# Patient Record
Sex: Female | Born: 2000 | Race: Black or African American | Hispanic: No | Marital: Single | Smoking: Never smoker
Health system: Southern US, Community
[De-identification: ages and names within clinical notes are randomized; demographics above are authoritative.]

## PROBLEM LIST (undated history)

## (undated) DIAGNOSIS — M419 Scoliosis, unspecified: Secondary | ICD-10-CM

## (undated) DIAGNOSIS — T7840XA Allergy, unspecified, initial encounter: Secondary | ICD-10-CM

## (undated) DIAGNOSIS — J45909 Unspecified asthma, uncomplicated: Secondary | ICD-10-CM

## (undated) HISTORY — DX: Unspecified asthma, uncomplicated: J45.909

## (undated) HISTORY — PX: FRACTURE SURGERY: SHX138

## (undated) HISTORY — DX: Allergy, unspecified, initial encounter: T78.40XA

## (undated) HISTORY — DX: Scoliosis, unspecified: M41.9

---
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
## 2018-07-27 DIAGNOSIS — R109 Unspecified abdominal pain: Secondary | ICD-10-CM | POA: Diagnosis not present

## 2018-07-27 DIAGNOSIS — N898 Other specified noninflammatory disorders of vagina: Secondary | ICD-10-CM | POA: Diagnosis not present

## 2018-08-05 ENCOUNTER — Ambulatory Visit (INDEPENDENT_AMBULATORY_CARE_PROVIDER_SITE_OTHER): Payer: 59 | Admitting: Medical

## 2018-08-05 ENCOUNTER — Encounter: Payer: Self-pay | Admitting: Medical

## 2018-08-05 VITALS — BP 110/60 | HR 88 | Temp 98.3°F | Resp 16 | Ht 66.0 in | Wt 113.4 lb

## 2018-08-05 DIAGNOSIS — N760 Acute vaginitis: Secondary | ICD-10-CM | POA: Diagnosis not present

## 2018-08-05 DIAGNOSIS — D72829 Elevated white blood cell count, unspecified: Secondary | ICD-10-CM | POA: Insufficient documentation

## 2018-08-05 DIAGNOSIS — R1010 Upper abdominal pain, unspecified: Secondary | ICD-10-CM | POA: Insufficient documentation

## 2018-08-05 DIAGNOSIS — M2170 Unequal limb length (acquired), unspecified site: Secondary | ICD-10-CM | POA: Diagnosis not present

## 2018-08-05 DIAGNOSIS — M419 Scoliosis, unspecified: Secondary | ICD-10-CM

## 2018-08-05 DIAGNOSIS — B9689 Other specified bacterial agents as the cause of diseases classified elsewhere: Secondary | ICD-10-CM | POA: Diagnosis not present

## 2018-08-05 NOTE — Progress Notes (Signed)
Subjective:     Patient ID: Brenda Kane, female   DOB: 2001/03/03, 18 y.o.   MRN: [id number]  HPI Chief Complaint  Patient presents with  . NP    NP to get  established, stomach pain X 2 weeks   Here as a new patient.  Referred by gynecology.    She notes some pains in upper stomach and lower chest x 2 week, pain under ribs, more focused in middle.   Pain worse with sneezing.   Had some nausea the first week, but no vomiting.   Used some anti-nausea medication.    No diarrhea.  No constipation.  Has BM 2 or 3 x daily.   Denies lots of bloating and gas.  No recent SOB, dyspnea, no recent asthma flare.   No recent injury or trauma.   No recent strenuous exercise.     denies eating a lot of acidic or spicy foods.   No recent use of GERD medication, no prior diagnosis of GERD.   No recent fever, no night sweats, no chills.    Saw gynecology recently, had some labs that showed elevated white cells.   Just recently finished antibiotic for bacterial vagina infection.   No STD.  No hx/o ovarian cyst, no hx/o endometriosis.     Denies urinary symptoms today.     Past Medical History:  Diagnosis Date  . Allergy   . Asthma    Current Outpatient Medications on File Prior to Visit  Medication Sig Dispense Refill  . albuterol (PROVENTIL HFA;VENTOLIN HFA) 108 (90 BASE) MCG/ACT inhaler Inhale 2 puffs into the lungs every 6 (six) hours as needed for wheezing.    . montelukast (SINGULAIR) 10 MG tablet Take 10 mg by mouth at bedtime.     No current facility-administered medications on file prior to visit.    Review of Systems As in subjective    Objective:   Physical Exam  BP 110/60   Pulse 88   Temp 98.3 F (36.8 C) (Oral)   Resp 16   Ht 5\' 6"  (1.676 m)   Wt 113 lb 6.4 oz (51.4 kg)   LMP 07/30/2018 (Exact Date)   SpO2 99%   BMI 18.30 kg/m   General appearance: alert, no distress, WD/WN, petite AA female Lungs: CTA bilaterally, no wheezes, rhonchi, or rales Back with moderate  scoliosis but nontender, normal ROM Right leg seems slightly shorter than left, but legs and hips nontender, normal ROM Abdomen: +bs, soft, mild generalized upper tendonesis, mild suprapubic tenderness, otherwise  non tender, non distended, no masses, no hepatomegaly, no splenomegaly Pulses: 2+ symmetric, upper and lower extremities, normal cap refill Legs neurovascularly intact  I reviewed labs from care everywhere from 07/27/2018, comprehensive metabolic panel was normal, white blood cell count was 13.7 thousand, absolute neutrophils absolute eosinophils somewhat elevated, lipase normal, urine dipstick with large blood, on her menstrual period, urine culture sent but I do not have results for this.  Wet prep showed positive whiff test clue cells     Assessment:     Encounter Diagnoses  Name Primary?  Marland Kitchen Upper abdominal pain Yes  . Leukocytosis, unspecified type   . Bacterial vaginitis   . Scoliosis, unspecified scoliosis type, unspecified spinal region   . Leg length difference, acquired        Plan:     Discussed symptoms, exam findings, reviewed recent gynecology office note and labs.   We appreciate gynecology referral.     Discussed possible causes  of symptoms.    discussed recommendations below.  Patient Instructions  You just recently use an antibiotic for bacterial vaginal infection which can cause your white cells to be elevated and give you some belly discomfort  Metronidazole can sometimes cause belly discomfort by itself   Recommendations  Begin sample of Dexilant 1 tablet daily for the next 8 days   Avoid spicy and acidic foods including peppers, tomato-based foods, salsa, spaghetti sauce, hot sauce, orange juice, lemons, limes, or other acidic or spicy foods  Do not eat fast, slow down your eating  Avoid big portions and a lot of junk food  Begin using a foam or other shoe insert in the right shoe to help equalize leg length discrepancy which is likely due to  your scoliosis  Stretch regularly, exercise regularly  Let us wait out the next 2 weeks to see if your symptoms resolve  If your symptoms do not improve over the next 2 weeks or if you have new or worsening symptoms call or recheck as there might be other tests we need to do such as x-ray or ultrasound or other  Regardless let us plan to recheck your blood count in a month or so

## 2018-08-05 NOTE — Patient Instructions (Signed)
You just recently use an antibiotic for bacterial vaginal infection which can cause your white cells to be elevated and give you some belly discomfort  Metronidazole can sometimes cause belly discomfort by itself   Recommendations  Begin sample of Dexilant 1 tablet daily for the next 8 days   Avoid spicy and acidic foods including peppers, tomato-based foods, salsa, spaghetti sauce, hot sauce, orange juice, lemons, limes, or other acidic or spicy foods  Do not eat fast, slow down your eating  Avoid big portions and a lot of junk food  Begin using a foam or other shoe insert in the right shoe to help equalize leg length discrepancy which is likely due to your scoliosis  Stretch regularly, exercise regularly  Let us wait out the next 2 weeks to see if your symptoms resolve  If your symptoms do not improve over the next 2 weeks or if you have new or worsening symptoms call or recheck as there might be other tests we need to do such as x-ray or ultrasound or other  Regardless let us plan to recheck your blood count in a month or so

## 2018-09-02 ENCOUNTER — Telehealth: Payer: Self-pay

## 2018-09-02 NOTE — Telephone Encounter (Signed)
Called pt to advise of [employer] and to ask about last labs. No answer LVM KH

## 2018-09-03 ENCOUNTER — Ambulatory Visit: Payer: 59 | Admitting: Medical

## 2018-11-27 DIAGNOSIS — J3089 Other allergic rhinitis: Secondary | ICD-10-CM | POA: Diagnosis not present

## 2018-11-27 DIAGNOSIS — J301 Allergic rhinitis due to pollen: Secondary | ICD-10-CM | POA: Diagnosis not present

## 2018-11-27 DIAGNOSIS — J4520 Mild intermittent asthma, uncomplicated: Secondary | ICD-10-CM | POA: Diagnosis not present

## 2018-11-27 DIAGNOSIS — J3081 Allergic rhinitis due to animal (cat) (dog) hair and dander: Secondary | ICD-10-CM | POA: Diagnosis not present

## 2019-02-04 ENCOUNTER — Telehealth: Payer: Self-pay | Admitting: Medical

## 2019-02-04 NOTE — Telephone Encounter (Signed)
Pt called and wanted to know if she could get a not emailed to her stating that she has ADHD because she has a large dog and will be moving to an apt and they told her she needed a note basically stating that she needs the dog for comfort. She can be reached at [phone]

## 2019-02-05 NOTE — Telephone Encounter (Signed)
This would require a visit.  I don't show any chart record of ADHD.   If we don't have records scanned in chart from prior records (please check), then we need her to sign to get prior records showing hx/o ADHD.

## 2019-02-05 NOTE — Telephone Encounter (Signed)
Called and left pt a voicemail to schedule visit

## 2019-03-11 ENCOUNTER — Encounter: Payer: Self-pay | Admitting: Family Medicine

## 2019-03-11 ENCOUNTER — Ambulatory Visit (INDEPENDENT_AMBULATORY_CARE_PROVIDER_SITE_OTHER): Payer: 59 | Admitting: Family Medicine

## 2019-03-11 ENCOUNTER — Other Ambulatory Visit: Payer: Self-pay | Admitting: Family Medicine

## 2019-03-11 ENCOUNTER — Other Ambulatory Visit: Payer: Self-pay

## 2019-03-11 ENCOUNTER — Ambulatory Visit
Admission: RE | Admit: 2019-03-11 | Discharge: 2019-03-11 | Disposition: A | Discharge status: Home / Self Care | Payer: 59 | Source: Ambulatory Visit | Attending: Family Medicine | Admitting: Family Medicine

## 2019-03-11 VITALS — BP 122/72 | HR 103 | Temp 98.2°F | Wt 120.0 lb

## 2019-03-11 DIAGNOSIS — M545 Low back pain, unspecified: Secondary | ICD-10-CM

## 2019-03-11 DIAGNOSIS — M25552 Pain in left hip: Secondary | ICD-10-CM

## 2019-03-11 DIAGNOSIS — M419 Scoliosis, unspecified: Secondary | ICD-10-CM | POA: Diagnosis not present

## 2019-03-11 DIAGNOSIS — M2170 Unequal limb length (acquired), unspecified site: Secondary | ICD-10-CM

## 2019-03-11 DIAGNOSIS — M25562 Pain in left knee: Secondary | ICD-10-CM | POA: Diagnosis not present

## 2019-03-11 DIAGNOSIS — G8929 Other chronic pain: Secondary | ICD-10-CM

## 2019-03-11 DIAGNOSIS — M4186 Other forms of scoliosis, lumbar region: Secondary | ICD-10-CM | POA: Diagnosis not present

## 2019-03-11 DIAGNOSIS — R1032 Left lower quadrant pain: Secondary | ICD-10-CM | POA: Diagnosis not present
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
## 2019-03-11 NOTE — Patient Instructions (Signed)
Go to [facility] for X rays of your low back and left hip.   I am referring you to [place]. They will call you to schedule.   In the meantime, you may take Advil 600 mg 2-3 times per day with food and a full glass of water.  Use heat or ice 2-3 times per day.   If you were to have total leg numbness, loss of control of your bladder or bowels or any other serious symptoms, you should to the emergency department.

## 2019-03-17 DIAGNOSIS — M419 Scoliosis, unspecified: Secondary | ICD-10-CM | POA: Diagnosis not present

## 2019-03-17 DIAGNOSIS — Z681 Body mass index (BMI) 19 or less, adult: Secondary | ICD-10-CM | POA: Diagnosis not present

## 2019-03-22 ENCOUNTER — Other Ambulatory Visit: Payer: Self-pay | Admitting: Orthopaedic Surgery

## 2019-03-22 DIAGNOSIS — M419 Scoliosis, unspecified: Secondary | ICD-10-CM

## 2019-04-01 DIAGNOSIS — Z3040 Encounter for surveillance of contraceptives, unspecified: Secondary | ICD-10-CM | POA: Diagnosis not present

## 2019-04-01 DIAGNOSIS — N920 Excessive and frequent menstruation with regular cycle: Secondary | ICD-10-CM | POA: Diagnosis not present

## 2019-04-03 ENCOUNTER — Emergency Department (HOSPITAL_COMMUNITY)
Admission: EM | Admit: 2019-04-03 | Discharge: 2019-04-04 | Disposition: A | Discharge status: Home / Self Care | Payer: 59 | Attending: Emergency Medicine | Admitting: Emergency Medicine

## 2019-04-03 ENCOUNTER — Encounter (HOSPITAL_COMMUNITY): Payer: Self-pay | Admitting: Emergency Medicine

## 2019-04-03 ENCOUNTER — Emergency Department (HOSPITAL_COMMUNITY): Payer: 59

## 2019-04-03 ENCOUNTER — Other Ambulatory Visit: Payer: Self-pay

## 2019-04-03 DIAGNOSIS — M25532 Pain in left wrist: Secondary | ICD-10-CM | POA: Diagnosis not present

## 2019-04-03 DIAGNOSIS — Z5321 Procedure and treatment not carried out due to patient leaving prior to being seen by health care provider: Secondary | ICD-10-CM | POA: Insufficient documentation

## 2019-04-03 DIAGNOSIS — S63502A Unspecified sprain of left wrist, initial encounter: Secondary | ICD-10-CM | POA: Diagnosis not present

## 2019-04-03 DIAGNOSIS — M7989 Other specified soft tissue disorders: Secondary | ICD-10-CM | POA: Diagnosis not present

## 2019-04-03 NOTE — ED Triage Notes (Signed)
Pt c/o left wrist pain and swelling after falling today.

## 2019-04-04 ENCOUNTER — Encounter (HOSPITAL_COMMUNITY): Payer: Self-pay

## 2019-04-04 ENCOUNTER — Emergency Department (HOSPITAL_COMMUNITY): Payer: 59

## 2019-04-04 ENCOUNTER — Emergency Department (HOSPITAL_COMMUNITY)
Admission: EM | Admit: 2019-04-04 | Discharge: 2019-04-04 | Disposition: A | Discharge status: Home / Self Care | Payer: 59 | Source: Home / Self Care | Attending: Emergency Medicine | Admitting: Emergency Medicine

## 2019-04-04 ENCOUNTER — Ambulatory Visit
Admission: RE | Admit: 2019-04-04 | Discharge: 2019-04-04 | Disposition: A | Discharge status: Home / Self Care | Payer: 59 | Source: Ambulatory Visit | Attending: Orthopaedic Surgery | Admitting: Orthopaedic Surgery

## 2019-04-04 DIAGNOSIS — Y939 Activity, unspecified: Secondary | ICD-10-CM | POA: Insufficient documentation

## 2019-04-04 DIAGNOSIS — M4186 Other forms of scoliosis, lumbar region: Secondary | ICD-10-CM | POA: Diagnosis not present

## 2019-04-04 DIAGNOSIS — S63502A Unspecified sprain of left wrist, initial encounter: Secondary | ICD-10-CM

## 2019-04-04 DIAGNOSIS — Z79899 Other long term (current) drug therapy: Secondary | ICD-10-CM | POA: Insufficient documentation

## 2019-04-04 DIAGNOSIS — W010XXA Fall on same level from slipping, tripping and stumbling without subsequent striking against object, initial encounter: Secondary | ICD-10-CM | POA: Insufficient documentation

## 2019-04-04 DIAGNOSIS — Y929 Unspecified place or not applicable: Secondary | ICD-10-CM | POA: Insufficient documentation

## 2019-04-04 DIAGNOSIS — Y999 Unspecified external cause status: Secondary | ICD-10-CM | POA: Insufficient documentation

## 2019-04-04 DIAGNOSIS — M419 Scoliosis, unspecified: Secondary | ICD-10-CM

## 2019-04-04 DIAGNOSIS — Z5321 Procedure and treatment not carried out due to patient leaving prior to being seen by health care provider: Secondary | ICD-10-CM | POA: Diagnosis not present

## 2019-04-04 DIAGNOSIS — J45909 Unspecified asthma, uncomplicated: Secondary | ICD-10-CM | POA: Insufficient documentation
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
## 2019-04-04 NOTE — Discharge Instructions (Signed)
Take Tylenol or Ibuprofen for pain Use heat on the affected area and do gentle range of motion exercises Wear brace as needed until pain is improved Follow up with your doctor

## 2019-04-04 NOTE — ED Notes (Signed)
L wrist pain with movement. CNS intact.

## 2019-04-04 NOTE — ED Notes (Signed)
Patient Alert and oriented to baseline. Stable and ambulatory to baseline. Patient verbalized understanding of the discharge instructions.  Patient belongings were taken by the patient.   
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
## 2019-04-04 NOTE — ED Notes (Signed)
Advised patient to stay, patient decided to leave anyway. 

## 2019-04-05 DIAGNOSIS — S63502A Unspecified sprain of left wrist, initial encounter: Secondary | ICD-10-CM | POA: Diagnosis not present

## 2019-04-22 DIAGNOSIS — M419 Scoliosis, unspecified: Secondary | ICD-10-CM | POA: Diagnosis not present

## 2019-04-26 DIAGNOSIS — S63502D Unspecified sprain of left wrist, subsequent encounter: Secondary | ICD-10-CM | POA: Diagnosis not present

## 2019-04-27 DIAGNOSIS — M545 Low back pain: Secondary | ICD-10-CM | POA: Diagnosis not present

## 2019-04-27 DIAGNOSIS — M41125 Adolescent idiopathic scoliosis, thoracolumbar region: Secondary | ICD-10-CM | POA: Diagnosis not present

## 2019-04-27 DIAGNOSIS — M6281 Muscle weakness (generalized): Secondary | ICD-10-CM | POA: Diagnosis not present

## 2019-04-27 DIAGNOSIS — G8921 Chronic pain due to trauma: Secondary | ICD-10-CM | POA: Diagnosis not present

## 2019-05-19 DIAGNOSIS — M4180 Other forms of scoliosis, site unspecified: Secondary | ICD-10-CM | POA: Diagnosis not present

## 2019-07-09 DIAGNOSIS — R030 Elevated blood-pressure reading, without diagnosis of hypertension: Secondary | ICD-10-CM | POA: Insufficient documentation

## 2020-03-09 DIAGNOSIS — Z20822 Contact with and (suspected) exposure to covid-19: Secondary | ICD-10-CM | POA: Diagnosis not present

## 2020-04-11 IMAGING — MR MR LUMBAR SPINE W/O CM
5 series · 48 of 48 positions shown · non-contrast
Comparison: Lumbar radiographs 03/11/2019

CLINICAL DATA: Low back pain bilateral leg pain

EXAM:
MRI LUMBAR SPINE WITHOUT CONTRAST
TECHNIQUE: Multiplanar, multisequence MR imaging of the lumbar spine was
performed. No intravenous contrast was administered.

[Series 3: T2 post-contrast · sagittal · 4.0mm · 0.88mm/px · 6 of 12 slices shown]
[im 1/12]
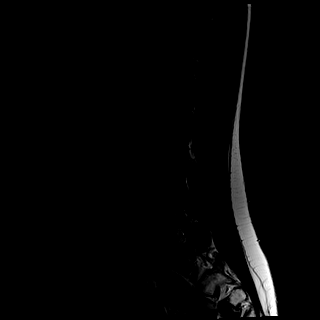
[im 3/12]
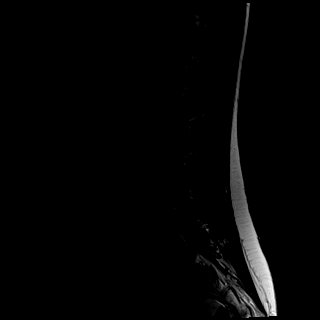
[im 5/12]
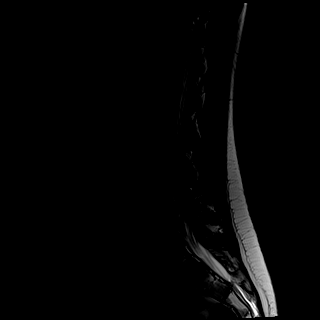
[im 7/12]
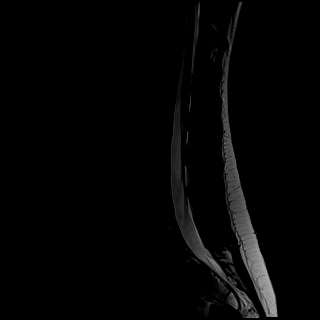
[im 9/12]
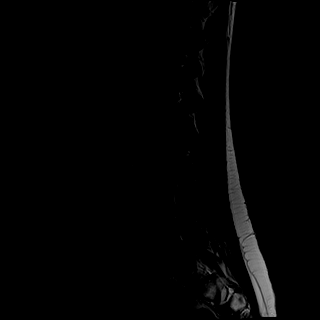
[im 12/12]
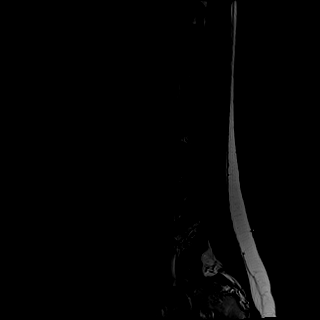

[Series 4: T1 · sagittal · 4.0mm · 0.88mm/px · 5 of 12 slices shown (1 of 2)]
[im 1/12]
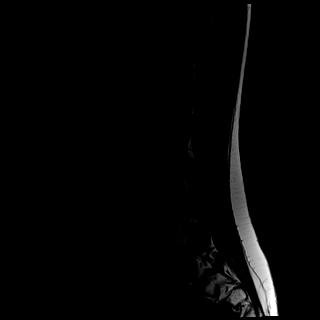
[im 3/12]
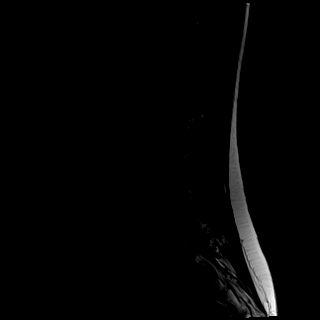
[im 6/12]
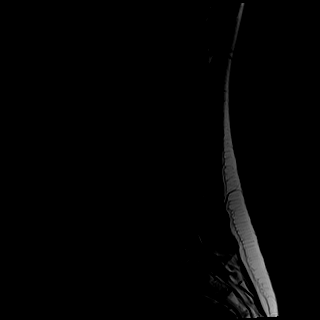
[im 9/12]
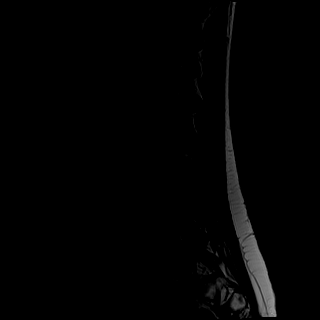
[im 12/12]
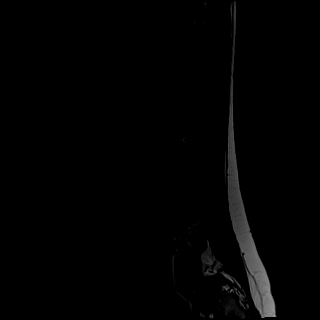

[Series 5: tirm sag · sagittal · 4.0mm · 0.55mm/px · 5 of 12 slices shown]
[im 1/12]
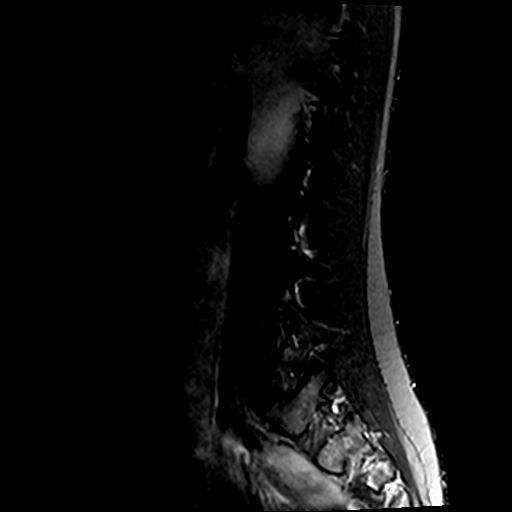
[im 3/12]
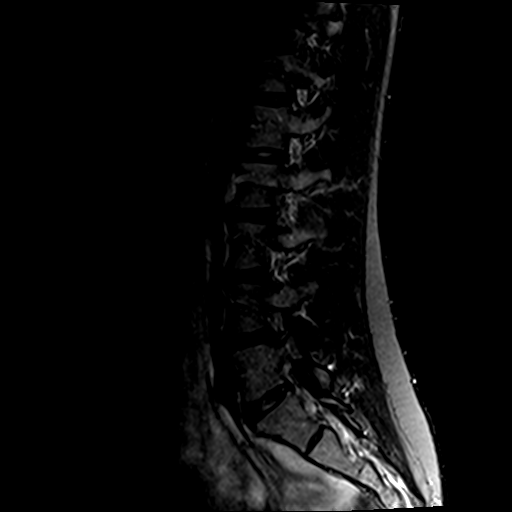
[im 6/12]
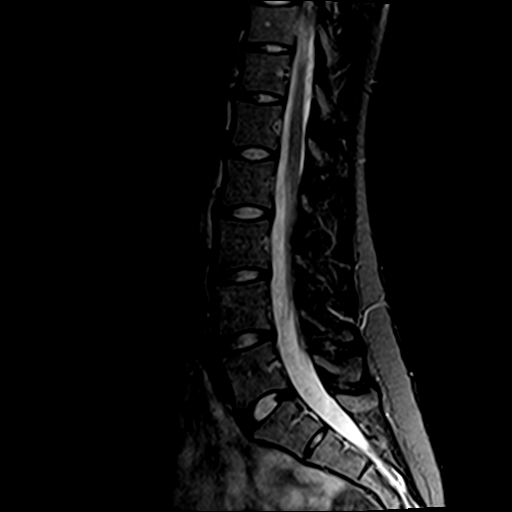
[im 9/12]
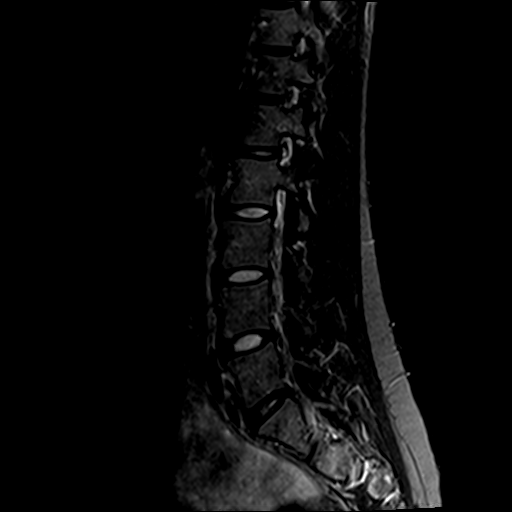
[im 12/12]
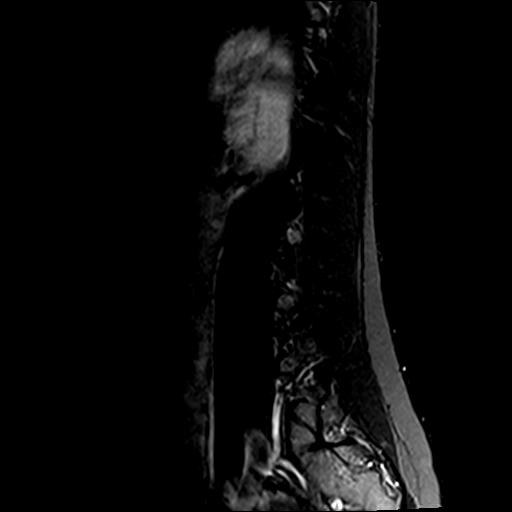

[Series 6: T1 · axial · 4.0mm · 0.78mm/px · z∈[-46,+166]mm · 16 of 38 slices shown (2 of 2)]
[im 1/38]
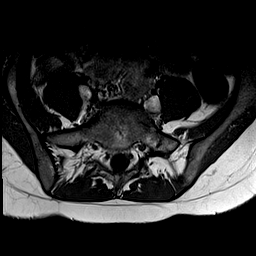
[im 3/38]
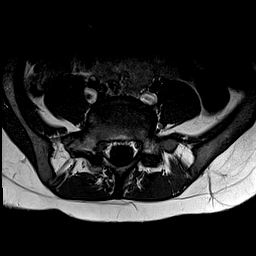
[im 5/38]
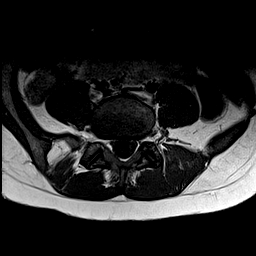
[im 8/38]
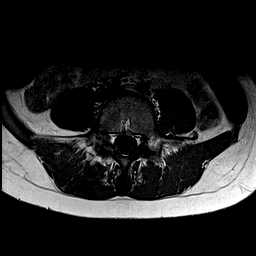
[im 10/38]
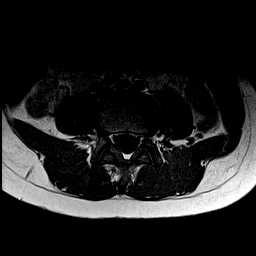
[im 13/38]
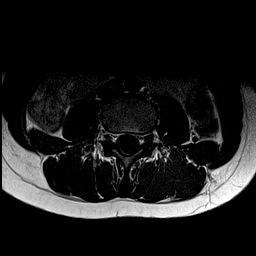
[im 15/38]
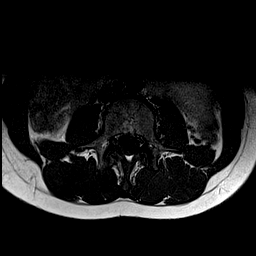
[im 18/38]
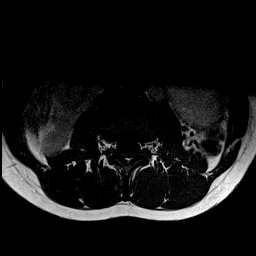
[im 20/38]
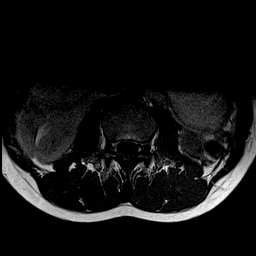
[im 23/38]
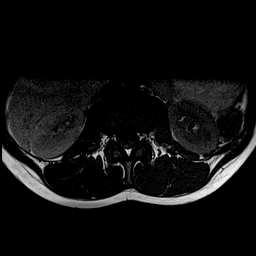
[im 25/38]
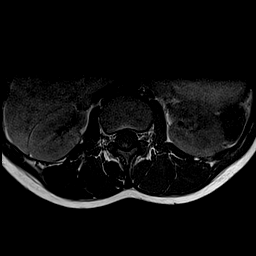
[im 28/38]
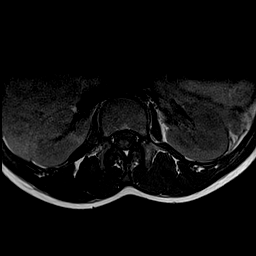
[im 30/38]
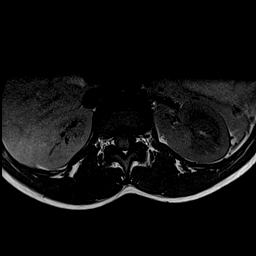
[im 33/38]
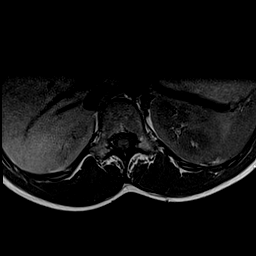
[im 35/38]
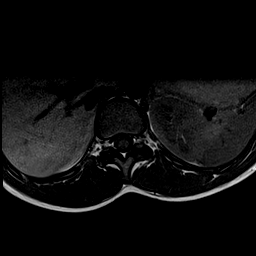
[im 38/38]
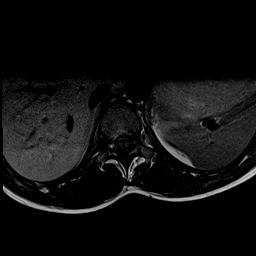

[Series 7: T2 · axial · 4.0mm · 0.78mm/px · z∈[-46,+166]mm · 16 of 38 slices shown]
[im 1/38]
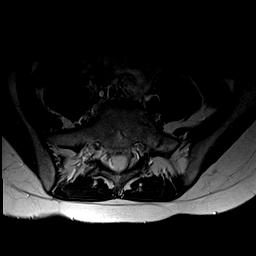
[im 3/38]
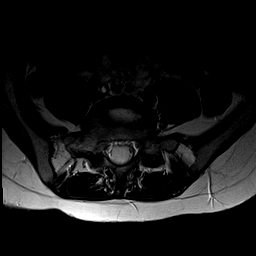
[im 5/38]
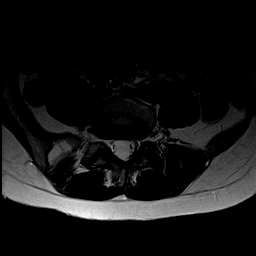
[im 8/38]
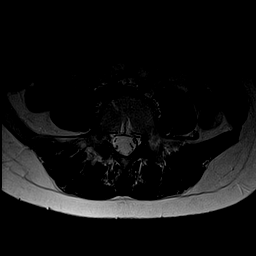
[im 10/38]
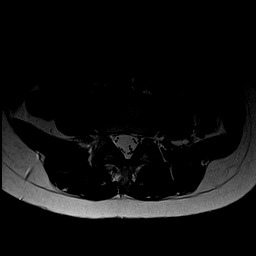
[im 13/38]
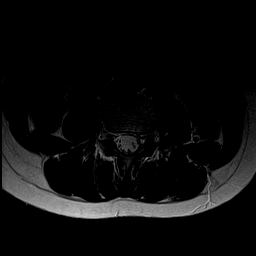
[im 15/38]
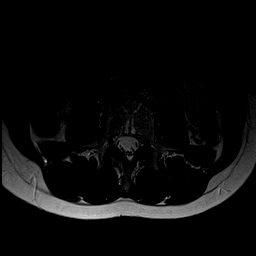
[im 18/38]
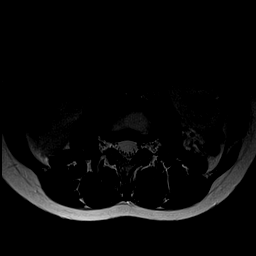
[im 20/38]
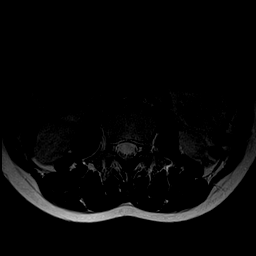
[im 23/38]
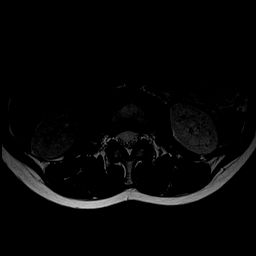
[im 25/38]
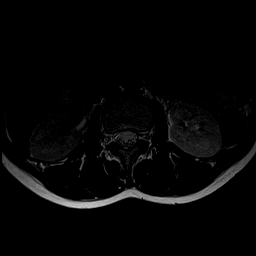
[im 28/38]
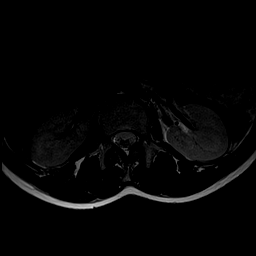
[im 30/38]
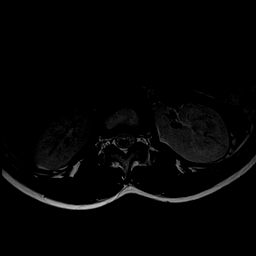
[im 33/38]
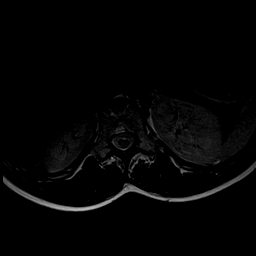
[im 35/38]
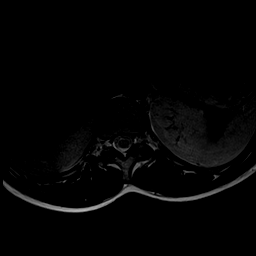
[im 38/38]
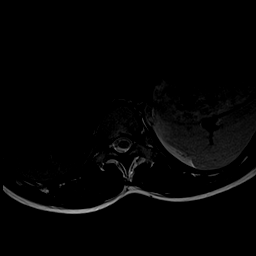

[48 of 48 positions shown; findings below may reference images not displayed]

FINDINGS: Segmentation:  Normal

Alignment:  Normal alignment.  Mild levoscoliosis

Vertebrae:  Normal bone marrow.  Negative for fracture or mass.

Conus medullaris and cauda equina: Conus extends to the L2-3 level.
Conus and cauda equina appear normal.

Paraspinal and other soft tissues: no paraspinous mass or
adenopathy. Negative for soft tissue edema

Disc levels:

Disc spaces well hydrated and normal in height. No degenerative
changes in the disc and facet joints. No disc protrusion or spinal
stenosis.
IMPRESSION: Mild levoscoliosis otherwise within normal limits. Negative for
neural impingement.

## 2021-01-15 ENCOUNTER — Inpatient Hospital Stay (HOSPITAL_COMMUNITY): Payer: Medicaid Other

## 2021-01-15 ENCOUNTER — Inpatient Hospital Stay (HOSPITAL_COMMUNITY)
Admission: AD | Admit: 2021-01-15 | Discharge: 2021-01-15 | Disposition: A | Discharge status: Home / Self Care | Payer: Medicaid Other | Attending: Obstetrics and Gynecology | Admitting: Obstetrics and Gynecology

## 2021-01-15 ENCOUNTER — Encounter (HOSPITAL_COMMUNITY): Payer: Self-pay | Admitting: *Deleted

## 2021-01-15 DIAGNOSIS — Z3A01 Less than 8 weeks gestation of pregnancy: Secondary | ICD-10-CM | POA: Insufficient documentation

## 2021-01-15 DIAGNOSIS — O209 Hemorrhage in early pregnancy, unspecified: Secondary | ICD-10-CM

## 2021-01-15 DIAGNOSIS — O26891 Other specified pregnancy related conditions, first trimester: Secondary | ICD-10-CM | POA: Insufficient documentation

## 2021-01-15 DIAGNOSIS — O26851 Spotting complicating pregnancy, first trimester: Secondary | ICD-10-CM | POA: Insufficient documentation

## 2021-01-15 DIAGNOSIS — R1030 Lower abdominal pain, unspecified: Secondary | ICD-10-CM | POA: Insufficient documentation

## 2021-01-15 DIAGNOSIS — O00201 Right ovarian pregnancy without intrauterine pregnancy: Secondary | ICD-10-CM

## 2021-01-15 LAB — COMPREHENSIVE METABOLIC PANEL
ALT: 7 U/L (ref 0–44)
AST: 16 U/L (ref 15–41)
Albumin: 4.1 g/dL (ref 3.5–5.0)
Alkaline Phosphatase: 37 U/L — ABNORMAL LOW (ref 38–126)
Anion gap: 7 (ref 5–15)
BUN: 9 mg/dL (ref 6–20)
CO2: 24 mmol/L (ref 22–32)
Calcium: 9.3 mg/dL (ref 8.9–10.3)
Chloride: 104 mmol/L (ref 98–111)
Creatinine, Ser: 0.72 mg/dL (ref 0.44–1.00)
GFR, Estimated: >60 mL/min (ref >60)
Glucose, Bld: 79 mg/dL (ref 70–99)
Potassium: 3.8 mmol/L (ref 3.5–5.1)
Sodium: 135 mmol/L (ref 135–145)
Total Bilirubin: 0.6 mg/dL (ref 0.3–1.2)
Total Protein: 7.3 g/dL (ref 6.5–8.1)

## 2021-01-15 LAB — POCT PREGNANCY, URINE: Preg Test, Ur: POSITIVE — AB

## 2021-01-15 LAB — CBC
HCT: 35.3 % — ABNORMAL LOW (ref 36.0–46.0)
Hemoglobin: 11.6 g/dL — ABNORMAL LOW (ref 12.0–15.0)
MCH: 28.8 pg (ref 26.0–34.0)
MCHC: 32.9 g/dL (ref 30.0–36.0)
MCV: 87.6 fL (ref 80.0–100.0)
Platelets: 278 10*3/uL (ref 150–400)
RBC: 4.03 MIL/uL (ref 3.87–5.11)
RDW: 15 % (ref 11.5–15.5)
WBC: 6.1 10*3/uL (ref 4.0–10.5)
nRBC: 0 % (ref 0.0–0.2)

## 2021-01-15 LAB — WET PREP, GENITAL
Sperm: NONE SEEN
Trich, Wet Prep: NONE SEEN
Yeast Wet Prep HPF POC: NONE SEEN

## 2021-01-15 LAB — ABO/RH: ABO/RH(D): O POS

## 2021-01-15 LAB — HCG, QUANTITATIVE, PREGNANCY: hCG, Beta Chain, Quant, S: 3750 m[IU]/mL — ABNORMAL HIGH (ref <5)

## 2021-01-15 MED ORDER — METHOTREXATE FOR ECTOPIC PREGNANCY
50.0000 mg/m2 | Freq: Once | INTRAMUSCULAR | Status: AC
  Administered 2021-01-15: 81.5 mg via INTRAMUSCULAR
  Filled 2021-01-15: qty 3.26

## 2021-01-15 NOTE — MAU Note (Signed)
In lobby bathroom

## 2021-01-15 NOTE — MAU Note (Signed)
PT SAYS PREG CONFIRMED  TODAY AT [facility]  WAS IN OFFICE TODAY- BEFORE  SHE LEFT -  SAW VAG BLEEDING- ON UNDERWEAR- TOLD TO GO TO OB DR.  IN LOBBY BATHROOM- SHE SAW NO BLOOD.  FELT CRAMPS THIS AM-  NO CRAMPS NOW- PAIN 0.

## 2021-01-15 NOTE — MAU Provider Note (Signed)
Chief Complaint:  Vaginal Bleeding and Abdominal Pain    HPI: Brenda Kane is a 20 y.o. G1P0 at [redacted]w[redacted]d who presents to maternity admissions reporting lower abdominal cramping and vaginal bleeding. Patient reports last weekend she had a +upt at home. Went to [facility] today for confirmation. Reports she has had intermittent lower abdominal cramping since Saturday. Started having some bleeding while at [facility] today that got on her underwear, however reports only some spotting since. Went to the bathroom here and did not see any blood. She reports some "normal" vaginal discharge that has a slight odor, but denies itching or irritation. Denies urinary s/s, fever, or chills. LMP 12/07/2020  Pregnancy Course:   Past Medical History:  Diagnosis Date   Allergy    Asthma    Scoliosis    OB History  Gravida Para Term Preterm AB Living  1            SAB IAB Ectopic Multiple Live Births               # Outcome Date GA Lbr Len/2nd Weight Sex Delivery Anes PTL Lv  1 Current            Past Surgical History:  Procedure Laterality Date   FRACTURE SURGERY Left    wrist   Family History  Problem Relation Age of Onset   Diabetes Mother    Social History   Tobacco Use   Smoking status: Never   Smokeless tobacco: Never  Substance Use Topics   Alcohol use: Not Currently   Drug use: No   Allergies  Allergen Reactions   Fish Allergy Hives   Other Rash    Peanuts   Shellfish Allergy Rash   Medications Prior to Admission  Medication Sig Dispense Refill Last Dose   albuterol (PROVENTIL HFA;VENTOLIN HFA) 108 (90 BASE) MCG/ACT inhaler Inhale 2 puffs into the lungs every 6 (six) hours as needed for wheezing.   01/14/2021   montelukast (SINGULAIR) 10 MG tablet Take 10 mg by mouth at bedtime.   01/14/2021   EPIPEN 2-PAK 0.3 MG/0.3ML SOAJ injection AS DIRECTED AS NEEDED FOR SYSTEMIC REACTION INJECTION 30 DAYS      SODIUM FLUORIDE 5000 PPM 1.1 % PSTE See admin instructions.       I have reviewed patient's Past Medical Hx, Surgical Hx, Family Hx, Social Hx, medications and allergies.   ROS:  Review of Systems  Constitutional: Negative.   Respiratory: Negative.    Cardiovascular: Negative.   Gastrointestinal:  Positive for abdominal pain (cramping).  Genitourinary:  Positive for vaginal bleeding (spotting) and vaginal discharge. Negative for dysuria and flank pain.  Neurological: Negative.    Physical Exam  Patient Vitals for the past 24 hrs:  BP Temp Temp src Pulse Height Weight  01/15/21 1917 117/69 98.6 F (37 C) Oral 82 5\' 6"  (1.676 m) 57.3 kg    Constitutional: well-developed, well-nourished female in no acute distress.  Cardiovascular: normal rate Respiratory: normal effort GI: abd soft, non-tender MS: extremities nontender, no edema, normal ROM Neurologic: alert and oriented x 4.  GU: neg CVAT. Pelvic: blind swabs     Labs: Results for orders placed or performed during the hospital encounter of 01/15/21 (from the past 24 hour(s))  Pregnancy, urine POC     Status: Abnormal   Collection Time: 01/15/21  7:27 PM  Result Value Ref Range   Preg Test, Ur POSITIVE (A) NEGATIVE  ABO/Rh     Status: None  Collection Time: 01/15/21  7:38 PM  Result Value Ref Range   ABO/RH(D) O POS    No rh immune globuloin      NOT A RH IMMUNE GLOBULIN CANDIDATE, PT RH POSITIVE Performed at [facility], [address], [place], [place] [address]   CBC     Status: Abnormal   Collection Time: 01/15/21  8:23 PM  Result Value Ref Range   WBC 6.1 4.0 - 10.5 K/uL   RBC 4.03 3.87 - 5.11 MIL/uL   Hemoglobin 11.6 (L) 12.0 - 15.0 g/dL   HCT 26.9 (L) 48.5 - 46.2 %   MCV 87.6 80.0 - 100.0 fL   MCH 28.8 26.0 - 34.0 pg   MCHC 32.9 30.0 - 36.0 g/dL   RDW 70.3 50.0 - 93.8 %   Platelets 278 150 - 400 K/uL   nRBC 0.0 0.0 - 0.2 %  Comprehensive metabolic panel     Status: Abnormal   Collection Time: 01/15/21  8:23 PM  Result Value Ref Range   Sodium 135 135 - 145  mmol/L   Potassium 3.8 3.5 - 5.1 mmol/L   Chloride 104 98 - 111 mmol/L   CO2 24 22 - 32 mmol/L   Glucose, Bld 79 70 - 99 mg/dL   BUN 9 6 - 20 mg/dL   Creatinine, Ser 1.82 0.44 - 1.00 mg/dL   Calcium 9.3 8.9 - 99.3 mg/dL   Total Protein 7.3 6.5 - 8.1 g/dL   Albumin 4.1 3.5 - 5.0 g/dL   AST 16 15 - 41 U/L   ALT 7 0 - 44 U/L   Alkaline Phosphatase 37 (L) 38 - 126 U/L   Total Bilirubin 0.6 0.3 - 1.2 mg/dL   GFR, Estimated >71 >69 mL/min   Anion gap 7 5 - 15  hCG, quantitative, pregnancy     Status: Abnormal   Collection Time: 01/15/21  8:23 PM  Result Value Ref Range   hCG, Beta Chain, Quant, S 3,750 (H) <5 mIU/mL  Wet prep, genital     Status: Abnormal   Collection Time: 01/15/21  8:23 PM   Specimen: Vaginal  Result Value Ref Range   Yeast Wet Prep HPF POC NONE SEEN NONE SEEN   Trich, Wet Prep NONE SEEN NONE SEEN   Clue Cells Wet Prep HPF POC PRESENT (A) NONE SEEN   WBC, Wet Prep HPF POC FEW (A) NONE SEEN   Sperm NONE SEEN    Imaging:  US OB LESS THAN 14 WEEKS WITH OB TRANSVAGINAL  Result Date: 01/15/2021 CLINICAL DATA:  Pregnant patient in first-trimester pregnancy with vaginal bleeding and cramping today. EXAM: OBSTETRIC <14 WK [place] AND TRANSVAGINAL OB US TECHNIQUE: Both transabdominal and transvaginal ultrasound examinations were performed for complete evaluation of the gestation as well as the maternal uterus, adnexal regions, and pelvic cul-de-sac. Transvaginal technique was performed to assess early pregnancy. COMPARISON:  None. FINDINGS: Intrauterine gestational sac: None Yolk sac:  Present within a gestational sac in the right adnexa. Embryo:  Not Visualized. Cardiac Activity: Not Visualized. Subchorionic hemorrhage:  Not applicable. Maternal uterus/adnexae: No intrauterine gestational sac. No fluid in the endometrial canal. The endometrium is thickened at 2.2 cm. There is a corpus luteal cyst in the right ovary. Adjacent to the right ovary is a small sac-like structure containing  a yolk sac consistent with adnexal ectopic. This measures approximately 1.6 x 1.1 x 1.2 cm. There is no fetal pole. Trace free fluid but no significant pelvic hemorrhage. Within the  left ovary is a 2.2 cm heterogeneous questionably solid lesion. Ovarian blood flow is seen. Trace pelvic free fluid. IMPRESSION: 1. Right adnexal ectopic measuring 1.6 x 1.1 x 1.2 cm with a gestational sac containing a yolk sac. No fetal pole. No evidence of rupture. 2. No intrauterine pregnancy.  Thickened endometrium at 2.2 cm. 3. Questionable solid lesion in the left ovary measuring 2.2 cm. Recommend follow-up ultrasound after resolution of acute event. Critical Value/emergent results were called by telephone at the time of interpretation on 01/15/2021 at 9:24 pm to provider [facility] , who verbally acknowledged these results. Electronically Signed   By: Narda Rutherford M.D.   On: 01/15/2021 21:26    MAU Course: Orders Placed This Encounter  Procedures   Wet prep, genital   US OB LESS THAN 14 WEEKS WITH OB TRANSVAGINAL   CBC   Comprehensive metabolic panel   hCG, quantitative, pregnancy   hCG, quantitative, pregnancy   Pregnancy, urine POC   ABO/Rh   Discharge patient   Meds ordered this encounter  Medications   methotrexate (for ectopic pregnancy) chemo injection 81.5 mg    MDM: CBC and CMP unremarkable  HCG 3,750 ABO/RH, O pos, Rhogam not indicated Wet prep +clue cells GC/CT collected [place] with results as above. Right adnexal ectopic with yolk sac, no fetal pole. No evidence of rupture.  Discussed with Dr. Adrian Blackwater who recommends that patient is a good candidate for MTX  The risks of methotrexate were reviewed including failure requiring repeat dosing or eventual surgery. She understands that methotrexate involves frequent return visits to monitor lab values and that she remains at risk of ectopic rupture until her beta is less than assay. ?The patient opts to proceed with methotrexate.  She has no  history of hepatic or renal dysfunction, has normal BUN/Cr/LFT's/platelets.  She is felt to be reliable for follow-up. Side effects of photosensitivity & GI upset were discussed.  She knows to avoid direct sunlight and abstain from alcohol, NSAIDs and sexual intercourse for two weeks. She was counseled to discontinue any MVI with folic acid. ?She understands to follow up on D4 (Thursday 8/11 at [facility]) and D7 (Sunday 8/14 in MAU) for repeat BHCG and was given the instruction sheet. ?Strict ectopic precautions were reviewed, the patient knows to call with any abdominal pain, vomiting, fainting, or any concerns with her health.   Assessment: 1. Ectopic pregnancy of right ovary   2. Vaginal bleeding affecting early pregnancy     Plan: Discharge home in stable condition  Strict return precautions reviewed with patient Follow up at [facility] for day 4 MTX follow up Return to MAU as needed    Follow-up Information     [facility] for [facility] Follow up.   Specialty: Obstetrics and Gynecology Why: on Thursday 01/18/21 at 0920 for day 4 s/p MTX hcg. Return to MAU as needed. Contact information: [address] [place] [address] [phone]                Allergies as of 01/15/2021       Reactions   Fish Allergy Hives   Other Rash   Peanuts   Shellfish Allergy Rash        Medication List     TAKE these medications    albuterol 108 (90 Base) MCG/ACT inhaler Commonly known as: VENTOLIN HFA Inhale 2 puffs into the lungs every 6 (six) hours as needed for wheezing.   EpiPen 2-Pak 0.3  mg/0.3 mL Soaj injection Generic drug: EPINEPHrine AS DIRECTED AS NEEDED FOR SYSTEMIC REACTION INJECTION 30 DAYS   montelukast 10 MG tablet Commonly known as: SINGULAIR Take 10 mg by mouth at bedtime.   Sodium Fluoride 5000 PPM 1.1 % Pste Generic drug: Sodium Fluoride See admin instructions.         Camelia Enganielle Samanthan Dugo, MSN,  CNM 01/15/2021 10:15 PM

## 2021-01-16 ENCOUNTER — Encounter (HOSPITAL_COMMUNITY): Payer: Self-pay | Admitting: Obstetrics & Gynecology

## 2021-01-16 ENCOUNTER — Other Ambulatory Visit: Payer: Self-pay

## 2021-01-16 ENCOUNTER — Inpatient Hospital Stay (HOSPITAL_COMMUNITY): Payer: Medicaid Other

## 2021-01-16 ENCOUNTER — Inpatient Hospital Stay (HOSPITAL_COMMUNITY): Payer: Medicaid Other | Admitting: Certified Registered Nurse Anesthetist

## 2021-01-16 ENCOUNTER — Encounter (HOSPITAL_COMMUNITY)
Admission: AD | Disposition: A | Discharge status: Home / Self Care | Payer: Self-pay | Source: Home / Self Care | Attending: Obstetrics & Gynecology

## 2021-01-16 ENCOUNTER — Ambulatory Visit (HOSPITAL_COMMUNITY)
Admission: AD | Admit: 2021-01-16 | Discharge: 2021-01-17 | Disposition: A | Discharge status: Home / Self Care | Payer: Medicaid Other | Attending: Obstetrics & Gynecology | Admitting: Obstetrics & Gynecology

## 2021-01-16 DIAGNOSIS — Z91010 Allergy to peanuts: Secondary | ICD-10-CM | POA: Insufficient documentation

## 2021-01-16 DIAGNOSIS — Z20822 Contact with and (suspected) exposure to covid-19: Secondary | ICD-10-CM | POA: Insufficient documentation

## 2021-01-16 DIAGNOSIS — O00101 Right tubal pregnancy without intrauterine pregnancy: Secondary | ICD-10-CM | POA: Insufficient documentation

## 2021-01-16 DIAGNOSIS — Z79899 Other long term (current) drug therapy: Secondary | ICD-10-CM | POA: Diagnosis not present

## 2021-01-16 DIAGNOSIS — O00109 Unspecified tubal pregnancy without intrauterine pregnancy: Secondary | ICD-10-CM

## 2021-01-16 DIAGNOSIS — Z91013 Allergy to seafood: Secondary | ICD-10-CM | POA: Diagnosis not present

## 2021-01-16 DIAGNOSIS — O26891 Other specified pregnancy related conditions, first trimester: Secondary | ICD-10-CM

## 2021-01-16 DIAGNOSIS — R109 Unspecified abdominal pain: Secondary | ICD-10-CM

## 2021-01-16 HISTORY — PX: LAPAROSCOPIC UNILATERAL SALPINGECTOMY: SHX5934

## 2021-01-16 LAB — CBC
HCT: 37.2 % (ref 36.0–46.0)
Hemoglobin: 11.7 g/dL — ABNORMAL LOW (ref 12.0–15.0)
MCH: 25.9 pg — ABNORMAL LOW (ref 26.0–34.0)
MCHC: 31.5 g/dL (ref 30.0–36.0)
MCV: 82.5 fL (ref 80.0–100.0)
Platelets: 310 10*3/uL (ref 150–400)
RBC: 4.51 MIL/uL (ref 3.87–5.11)
RDW: 13.7 % (ref 11.5–15.5)
WBC: 7.5 10*3/uL (ref 4.0–10.5)
nRBC: 0 % (ref 0.0–0.2)

## 2021-01-16 LAB — RESP PANEL BY RT-PCR (FLU A&B, COVID) ARPGX2
Influenza A by PCR: NEGATIVE
Influenza B by PCR: NEGATIVE
SARS Coronavirus 2 by RT PCR: NEGATIVE

## 2021-01-16 LAB — GC/CHLAMYDIA PROBE AMP (~~LOC~~) NOT AT ARMC
Chlamydia: NEGATIVE
Comment: NEGATIVE
Comment: NORMAL
Neisseria Gonorrhea: NEGATIVE

## 2021-01-16 LAB — HCG, QUANTITATIVE, PREGNANCY: hCG, Beta Chain, Quant, S: 3197 m[IU]/mL — ABNORMAL HIGH (ref <5)

## 2021-01-16 LAB — PREPARE RBC (CROSSMATCH)

## 2021-01-16 SURGERY — SALPINGECTOMY, UNILATERAL, LAPAROSCOPIC
Anesthesia: General | Laterality: Right

## 2021-01-16 MED ORDER — LACTATED RINGERS IV SOLN: INTRAVENOUS | Status: DC

## 2021-01-16 MED ORDER — MIDAZOLAM HCL 2 MG/2ML IJ SOLN
INTRAMUSCULAR | Status: AC
  Filled 2021-01-16: qty 2

## 2021-01-16 MED ORDER — LIDOCAINE 2% (20 MG/ML) 5 ML SYRINGE
INTRAMUSCULAR | Status: AC
  Filled 2021-01-16: qty 5

## 2021-01-16 MED ORDER — PROPOFOL 10 MG/ML IV BOLUS
INTRAVENOUS | Status: AC
  Filled 2021-01-16: qty 20

## 2021-01-16 MED ORDER — FENTANYL CITRATE (PF) 250 MCG/5ML IJ SOLN
INTRAMUSCULAR | Status: AC
  Filled 2021-01-16: qty 5

## 2021-01-16 MED ORDER — ACETAMINOPHEN 500 MG PO TABS
1000.0000 mg | ORAL_TABLET | Freq: Once | ORAL | Status: AC
  Administered 2021-01-16: 1000 mg via ORAL
  Filled 2021-01-16: qty 2

## 2021-01-16 MED ORDER — LIDOCAINE-EPINEPHRINE 1 %-1:100000 IJ SOLN
INTRAMUSCULAR | Status: AC
  Filled 2021-01-16: qty 1

## 2021-01-16 SURGICAL SUPPLY — 34 items
ADH SKN CLS LQ APL DERMABOND (GAUZE/BANDAGES/DRESSINGS) ×2
BAG SPEC RTRVL LRG 6X4 10 (ENDOMECHANICALS) ×4
CANISTER SUCTION 2500CC (MISCELLANEOUS) ×3 IMPLANT
DERMABOND ADHESIVE PROPEN (GAUZE/BANDAGES/DRESSINGS) ×1
DERMABOND ADVANCED .7 DNX6 (GAUZE/BANDAGES/DRESSINGS) ×1 IMPLANT
DRSG OPSITE POSTOP 3X4 (GAUZE/BANDAGES/DRESSINGS) ×3 IMPLANT
DURAPREP 26ML APPLICATOR (WOUND CARE) ×3 IMPLANT
GAUZE 4X4 16PLY ~~LOC~~+RFID DBL (SPONGE) ×4 IMPLANT
GLOVE SURG LTX SZ6.5 (GLOVE) ×3 IMPLANT
GLOVE SURG UNDER POLY LF SZ7 (GLOVE) ×10 IMPLANT
GOWN STRL REUS W/ TWL LRG LVL3 (GOWN DISPOSABLE) ×5 IMPLANT
GOWN STRL REUS W/TWL LRG LVL3 (GOWN DISPOSABLE) ×6
KIT TURNOVER KIT B (KITS) ×3 IMPLANT
LIGASURE VESSEL 5MM BLUNT TIP (ELECTROSURGICAL) ×3 IMPLANT
NDL INSUFFLATION 14GA 120MM (NEEDLE) IMPLANT
NEEDLE INSUFFLATION 14GA 120MM (NEEDLE) ×3 IMPLANT
NS IRRIG 1000ML POUR BTL (IV SOLUTION) ×3 IMPLANT
PACK LAPAROSCOPY BASIN (CUSTOM PROCEDURE TRAY) ×3 IMPLANT
PACK TRENDGUARD 450 HYBRID PRO (MISCELLANEOUS) ×1 IMPLANT
POUCH SPECIMEN RETRIEVAL 10MM (ENDOMECHANICALS) ×4 IMPLANT
PROTECTOR NERVE ULNAR (MISCELLANEOUS) ×6 IMPLANT
SET IRRIG TUBING LAPAROSCOPIC (IRRIGATION / IRRIGATOR) ×2 IMPLANT
SET TUBE SMOKE EVAC HIGH FLOW (TUBING) ×3 IMPLANT
SLEEVE ENDOPATH XCEL 5M (ENDOMECHANICALS) ×3 IMPLANT
SOLUTION ELECTROLUBE (MISCELLANEOUS) ×2 IMPLANT
SUT MNCRL AB 4-0 PS2 18 (SUTURE) ×3 IMPLANT
SUT VICRYL 0 UR6 27IN ABS (SUTURE) ×2 IMPLANT
SUT VICRYL 4-0 PS2 18IN ABS (SUTURE) ×2 IMPLANT
TOWEL GREEN STERILE FF (TOWEL DISPOSABLE) ×4 IMPLANT
TRAY FOLEY W/BAG SLVR 14FR (SET/KITS/TRAYS/PACK) ×3 IMPLANT
TRENDGUARD 450 HYBRID PRO PACK (MISCELLANEOUS) ×3
TROCAR XCEL NON-BLD 11X100MML (ENDOMECHANICALS) ×2 IMPLANT
TROCAR XCEL NON-BLD 5MMX100MML (ENDOMECHANICALS) ×3 IMPLANT
WARMER LAPAROSCOPE (MISCELLANEOUS) ×3 IMPLANT

## 2021-01-16 NOTE — MAU Note (Signed)
I have an ectopic pregnancy. I got a shot and today I am still bleeding and having bad cramps. Before was getting waves of nausea and wasn't sure if that was a side effect of medication. I called and was told to come in. I have used 3 reg pads today which were not saturated.

## 2021-01-16 NOTE — MAU Provider Note (Signed)
History     CSN: [id number]  Arrival date and time: 01/16/21 1904   Event Date/Time   First Provider Initiated Contact with Patient 01/16/21 2001      Chief Complaint  Patient presents with   Abdominal Pain   Vaginal Bleeding   HPI Brenda Kane is a 20 y.o. G1P0 at [redacted]w[redacted]d who presents with abdominal cramping & vaginal bleeding. Received methotrexate yesterday for a right ectopic pregnancy. Reports increase in pain & bleeding today. Not saturating pads. Reports intense lower abdominal pain that radiates to her bilateral groin. Rates pain 8/10. Hasn't treated symptoms. Nothing makes better or worse.  Plans on going to [facility]. Reports seeing them for gyn care as recently as a few months ago & has appointment with them in a few weeks.   OB History     Gravida  1   Para      Term      Preterm      AB      Living         SAB      IAB      Ectopic      Multiple      Live Births              Past Medical History:  Diagnosis Date   Allergy    Asthma    Scoliosis     Past Surgical History:  Procedure Laterality Date   FRACTURE SURGERY Left    wrist    Family History  Problem Relation Age of Onset   Diabetes Mother     Social History   Tobacco Use   Smoking status: Never   Smokeless tobacco: Never  Substance Use Topics   Alcohol use: Not Currently   Drug use: No    Allergies:  Allergies  Allergen Reactions   Fish Allergy Hives   Other Rash    Peanuts   Shellfish Allergy Rash    Medications Prior to Admission  Medication Sig Dispense Refill Last Dose   albuterol (PROVENTIL HFA;VENTOLIN HFA) 108 (90 BASE) MCG/ACT inhaler Inhale 2 puffs into the lungs every 6 (six) hours as needed for wheezing.      EPIPEN 2-PAK 0.3 MG/0.3ML SOAJ injection AS DIRECTED AS NEEDED FOR SYSTEMIC REACTION INJECTION 30 DAYS      montelukast (SINGULAIR) 10 MG tablet Take 10 mg by mouth at bedtime.      SODIUM FLUORIDE 5000 PPM 1.1 % PSTE See admin instructions.        Review of Systems  Constitutional: Negative.   Gastrointestinal:  Positive for abdominal pain and nausea. Negative for diarrhea and vomiting.  Genitourinary:  Positive for vaginal bleeding.  Physical Exam   Blood pressure 112/66, pulse 88, temperature 98.2 F (36.8 C), resp. rate 16, height 5\' 6"  (1.676 m), weight 55.8 kg, last menstrual period 12/07/2020.  Physical Exam Vitals and nursing note reviewed.  Constitutional:      General: She is not in acute distress.    Appearance: She is well-developed.  HENT:     Head: Normocephalic and atraumatic.  Eyes:     General: No scleral icterus. Pulmonary:     Effort: Pulmonary effort is normal. No respiratory distress.  Abdominal:     General: Abdomen is flat.     Palpations: Abdomen is rigid.     Tenderness: There is abdominal tenderness in the right lower quadrant, suprapubic area and left lower quadrant. There is no guarding or rebound.  Skin:  General: Skin is warm and dry.  Neurological:     Mental Status: She is alert.  Psychiatric:        Mood and Affect: Mood normal.        Behavior: Behavior normal.    MAU Course  Procedures No results found for this or any previous visit (from the past 24 hour(s)). US OB Transvaginal  Result Date: 01/16/2021 CLINICAL DATA:  Known ectopic pregnancy. EXAM: OBSTETRIC <14 WK [place] AND TRANSVAGINAL OB US TECHNIQUE: Both transabdominal and transvaginal ultrasound examinations were performed for complete evaluation of the gestation as well as the maternal uterus, adnexal regions, and pelvic cul-de-sac. Transvaginal technique was performed to assess early pregnancy. COMPARISON:  01/15/2021 FINDINGS: Intrauterine gestational sac: None. Yolk sac:  Visualized within a right adnexal ectopic gestation. Embryo:  Not visualized. Cardiac Activity: Not visualized. Subchorionic hemorrhage:  Not applicable Maternal uterus/adnexae: As on the prior study, there is a small thick rimmed cystic structure  consistent with gestational sac. Yolk sac again visualized within this structure and also potentially a fetal pole on the current study. Corpus luteum cyst noted within the right ovarian parenchyma Stable left ovary with apparent small solid lesion within the parenchyma. Lesion shows no substantial blood flow on color Doppler evaluation. Patient has developed a moderate amount of complex fluid in the cul-de-sac since yesterday's study. IMPRESSION: 1. Interval development of a moderate amount of complex free fluid in the cul-de-sac, suggesting hemoperitoneum. 2. Similar appearance of the left adnexal ectopic gestation. Electronically Signed   By: Kennith Center M.D.   On: 01/16/2021 21:11    MDM Patient presents with worsening pain since MTX yesterday. TTP on exam. Pain radiates to groin. Repeat ultrasound ordered to r/o rupture ectopic.   Today's ultrasound shows moderate amount of complex free fluid that was not present yesterday. Dr. Sallye Ober contacted who will take over care & manage patient surgically. Patient informed of results.   Assessment and Plan   1. Ectopic pregnancy, tubal   2. Abdominal pain during pregnancy in first trimester    -Dr. Sallye Ober will come see patient   Brenda Kane 01/16/2021, 8:01 PM

## 2021-01-16 NOTE — H&P (Addendum)
Brenda Kane is an 20 y.o. female. G1P0 with a known ectopic pregnancy and is s/p methotrexate yesterday presenting with increased abdominal pain and vaginal bleeding. A repeat ultrasound today shows hemoperitoneum in the pelvis and likely with ruptured ectopic pregnancy.  She is being taken for a laparoscopic removal of the ectopic pregnancy.    Pertinent Gynecological History: Menses: regular every month without intermenstrual spotting Bleeding: light vaginal bleeding Contraception: none DES exposure: unknown Blood transfusions: none Sexually transmitted diseases: no past history Previous GYN Procedures:  None   Last pap: normal Date: 2021   Menstrual History: Menarche age: Patient's last menstrual period was 12/07/2020.    Past Medical History:  Diagnosis Date   Allergy    Asthma    Scoliosis     Past Surgical History:  Procedure Laterality Date   FRACTURE SURGERY Left    wrist    Family History  Problem Relation Age of Onset   Diabetes Mother     Social History:  reports that she has never smoked. She has never used smokeless tobacco. She reports previous alcohol use. She reports that she does not use drugs.  Allergies:  Allergies  Allergen Reactions   Fish Allergy Hives   Other Rash    Peanuts   Shellfish Allergy Rash    Medications Prior to Admission  Medication Sig Dispense Refill Last Dose   montelukast (SINGULAIR) 10 MG tablet Take 10 mg by mouth at bedtime.   01/15/2021   albuterol (PROVENTIL HFA;VENTOLIN HFA) 108 (90 BASE) MCG/ACT inhaler Inhale 2 puffs into the lungs every 6 (six) hours as needed for wheezing.   More than a month   EPIPEN 2-PAK 0.3 MG/0.3ML SOAJ injection AS DIRECTED AS NEEDED FOR SYSTEMIC REACTION INJECTION 30 DAYS      SODIUM FLUORIDE 5000 PPM 1.1 % PSTE See admin instructions.       Review of Systems Constitutional: Denies fevers/chills Cardiovascular: Denies chest pain or palpitations Pulmonary: Denies coughing or  wheezing Gastrointestinal: Denies nausea, vomiting or diarrhea Genitourinary: Denies pelvic pain,  unusual vaginal discharge, dysuria, urgency or frequency. With unusual vaginal bleeding. With pelvic pain.  Musculoskeletal: Denies muscle or joint aches and pain.  Neurology: Denies abnormal sensations such as tingling or numbness.    Blood pressure 112/66, pulse 88, temperature 98.2 F (36.8 C), resp. rate 16, height 5\' 6"  (1.676 m), weight 55.8 kg, last menstrual period 12/07/2020. Physical Exam Constitutional: She is oriented to person, place, and time. She appears well-developed and well-nourished.  HENT:  Head: Normocephalic and atraumatic.  Neck: Normal range of motion.  Cardiovascular: Normal rate, regular rhythm and normal heart sounds.   Respiratory: Effort normal and breath sounds normal.  GI: Soft. Bowel sounds are normal. Abdomen is tender to exam bilaterally in lower quadrants, no rebound, no guarding.  Neurological: She is alert and oriented to person, place, and time.  Skin: Skin is warm and dry.  Psychiatric: She has a normal mood and affect. Her behavior is normal.   Results for orders placed or performed during the hospital encounter of 01/16/21 (from the past 24 hour(s))  Prepare RBC (crossmatch)     Status: None   Collection Time: 01/16/21  9:32 PM  Result Value Ref Range   Order Confirmation      ORDER PROCESSED BY BLOOD BANK Performed at [facility], [address], [place], [place]   CBC     Status: Abnormal   Collection Time: 01/16/21 10:00 PM  Result  Value Ref Range   WBC 7.5 4.0 - 10.5 K/uL   RBC 4.51 3.87 - 5.11 MIL/uL   Hemoglobin 11.7 (L) 12.0 - 15.0 g/dL   HCT 12.2 48.2 - 50.0 %   MCV 82.5 80.0 - 100.0 fL   MCH 25.9 (L) 26.0 - 34.0 pg   MCHC 31.5 30.0 - 36.0 g/dL   RDW 37.0 48.8 - 89.1 %   Platelets 310 150 - 400 K/uL   nRBC 0.0 0.0 - 0.2 %  Type and screen [facility]     Status: None (Preliminary result)    Collection Time: 01/16/21 10:00 PM  Result Value Ref Range   ABO/RH(D) O POS    Antibody Screen NEG    Sample Expiration      01/19/2021,2359 Performed at [facility], [address] [place], [place] [address]    Unit Number [id number]    Blood Component Type RED CELLS,LR    Unit division 00    Status of Unit ALLOCATED    Transfusion Status OK TO TRANSFUSE    Crossmatch Result Compatible    Unit Number [id number]    Blood Component Type RED CELLS,LR    Unit division 00    Status of Unit ALLOCATED    Transfusion Status OK TO TRANSFUSE    Crossmatch Result Compatible     US OB Transvaginal  Addendum Date: 01/16/2021   ADDENDUM REPORT: 01/16/2021 22:12 ADDENDUM: Laterality error in the IMPRESSION paragaph. As noted in the body of the report, the ectopic gestation is in the right adnexal space. The IMPRESSION point #2 should read as follows: "Similar appearance of the right adnexal ectopic gestation." I discussed this case with the attending physician in the MAU at approximately 10:11 p.m. on 01/16/2021. Electronically Signed   By: Kennith Center M.D.   On: 01/16/2021 22:12   Result Date: 01/16/2021 CLINICAL DATA:  Known ectopic pregnancy. EXAM: OBSTETRIC <14 WK [place] AND TRANSVAGINAL OB US TECHNIQUE: Both transabdominal and transvaginal ultrasound examinations were performed for complete evaluation of the gestation as well as the maternal uterus, adnexal regions, and pelvic cul-de-sac. Transvaginal technique was performed to assess early pregnancy. COMPARISON:  01/15/2021 FINDINGS: Intrauterine gestational sac: None. Yolk sac:  Visualized within a right adnexal ectopic gestation. Embryo:  Not visualized. Cardiac Activity: Not visualized. Subchorionic hemorrhage:  Not applicable Maternal uterus/adnexae: As on the prior study, there is a small thick rimmed cystic structure consistent with gestational sac. Yolk sac again visualized within this structure and also potentially a fetal pole  on the current study. Corpus luteum cyst noted within the right ovarian parenchyma Stable left ovary with apparent small solid lesion within the parenchyma. Lesion shows no substantial blood flow on color Doppler evaluation. Patient has developed a moderate amount of complex fluid in the cul-de-sac since yesterday's study. IMPRESSION: 1. Interval development of a moderate amount of complex free fluid in the cul-de-sac, suggesting hemoperitoneum. 2. Similar appearance of the left adnexal ectopic gestation. Electronically Signed: By: Kennith Center M.D. On: 01/16/2021 21:11   US OB LESS THAN 14 WEEKS WITH OB TRANSVAGINAL  Result Date: 01/15/2021 CLINICAL DATA:  Pregnant patient in first-trimester pregnancy with vaginal bleeding and cramping today. EXAM: OBSTETRIC <14 WK [place] AND TRANSVAGINAL OB US TECHNIQUE: Both transabdominal and transvaginal ultrasound examinations were performed for complete evaluation of the gestation as well as the maternal uterus, adnexal regions, and pelvic cul-de-sac. Transvaginal technique was performed to assess early pregnancy. COMPARISON:  None. FINDINGS: Intrauterine gestational  sac: None Yolk sac:  Present within a gestational sac in the right adnexa. Embryo:  Not Visualized. Cardiac Activity: Not Visualized. Subchorionic hemorrhage:  Not applicable. Maternal uterus/adnexae: No intrauterine gestational sac. No fluid in the endometrial canal. The endometrium is thickened at 2.2 cm. There is a corpus luteal cyst in the right ovary. Adjacent to the right ovary is a small sac-like structure containing a yolk sac consistent with adnexal ectopic. This measures approximately 1.6 x 1.1 x 1.2 cm. There is no fetal pole. Trace free fluid but no significant pelvic hemorrhage. Within the left ovary is a 2.2 cm heterogeneous questionably solid lesion. Ovarian blood flow is seen. Trace pelvic free fluid. IMPRESSION: 1. Right adnexal ectopic measuring 1.6 x 1.1 x 1.2 cm with a gestational sac  containing a yolk sac. No fetal pole. No evidence of rupture. 2. No intrauterine pregnancy.  Thickened endometrium at 2.2 cm. 3. Questionable solid lesion in the left ovary measuring 2.2 cm. Recommend follow-up ultrasound after resolution of acute event. Critical Value/emergent results were called by telephone at the time of interpretation on 01/15/2021 at 9:24 pm to provider [facility] , who verbally acknowledged these results. Electronically Signed   By: Narda Rutherford M.D.   On: 01/15/2021 21:26    Assessment/Plan: 20 y/o G1P0010 with ruptured ectopic pregnancy, failed methotrexate therapy, stable , - Management options discussed and will proceed with laparoscopic removal of ectopic pregnancy, removal of left or right fallopian tube, possible removal of left or right ovaries, possible laparotomy.  She desires removal of the affected tube with the ectopic pregnancy if the other tube appears normal.  However if the unaffected tube appears abnormal she desires salpingostomy and removal of only the ectopic pregnancy.   This procedure has been fully reviewed with the patient and written informed consent has been obtained. We discussed risks of the procedure to include but not limited to risks of bleeding, infection and damage to organs. We discussed risks of recurrent ectopic pregnancy in the case where the affected tube is left in situ.  All her questions were answered and she desires to proceed with the procedure.   Prescilla Sours, MD. 01/16/2021, 10:53 PM

## 2021-01-16 NOTE — Anesthesia Preprocedure Evaluation (Addendum)
Anesthesia Evaluation  Patient identified by MRN, date of birth, ID band Patient awake    Reviewed: Allergy & Precautions, NPO status , Patient's Chart, lab work & pertinent test results  Airway Mallampati: I  TM Distance: >3 FB Neck ROM: Full    Dental  (+) Teeth Intact, Dental Advisory Given   Pulmonary asthma ,    breath sounds clear to auscultation       Cardiovascular negative cardio ROS   Rhythm:Regular Rate:Normal     Neuro/Psych negative neurological ROS  negative psych ROS   GI/Hepatic negative GI ROS, Neg liver ROS,   Endo/Other  negative endocrine ROS  Renal/GU negative Renal ROS     Musculoskeletal negative musculoskeletal ROS (+)   Abdominal Normal abdominal exam  (+)   Peds  Hematology negative hematology ROS (+)   Anesthesia Other Findings   Reproductive/Obstetrics                            Anesthesia Physical Anesthesia Plan  ASA: 2 and emergent  Anesthesia Plan: General   Post-op Pain Management:    Induction: Intravenous  PONV Risk Score and Plan: 4 or greater and Ondansetron, Dexamethasone and Midazolam  Airway Management Planned: Oral ETT  Additional Equipment: None  Intra-op Plan:   Post-operative Plan: Extubation in OR  Informed Consent: I have reviewed the patients History and Physical, chart, labs and discussed the procedure including the risks, benefits and alternatives for the proposed anesthesia with the patient or authorized representative who has indicated his/her understanding and acceptance.     Dental advisory given  Plan Discussed with: CRNA  Anesthesia Plan Comments:        Anesthesia Quick Evaluation

## 2021-01-17 ENCOUNTER — Encounter (HOSPITAL_COMMUNITY): Payer: Self-pay | Admitting: Obstetrics & Gynecology

## 2021-01-17 DIAGNOSIS — Z91010 Allergy to peanuts: Secondary | ICD-10-CM | POA: Diagnosis not present

## 2021-01-17 DIAGNOSIS — O00101 Right tubal pregnancy without intrauterine pregnancy: Secondary | ICD-10-CM | POA: Diagnosis not present

## 2021-01-17 DIAGNOSIS — Z20822 Contact with and (suspected) exposure to covid-19: Secondary | ICD-10-CM | POA: Diagnosis not present

## 2021-01-17 DIAGNOSIS — Z91013 Allergy to seafood: Secondary | ICD-10-CM | POA: Diagnosis not present

## 2021-01-17 LAB — BPAM RBC
Blood Product Expiration Date: 202209132359
Blood Product Expiration Date: 202209132359
ISSUE DATE / TIME: 202208091746
ISSUE DATE / TIME: 202208091746
Unit Type and Rh: 5100
Unit Type and Rh: 5100

## 2021-01-17 LAB — TYPE AND SCREEN
ABO/RH(D): O POS
Antibody Screen: NEGATIVE
Unit division: 0
Unit division: 0

## 2021-01-17 MED ORDER — PROMETHAZINE HCL 25 MG/ML IJ SOLN: 6.2500 mg | INTRAMUSCULAR | Status: DC | PRN

## 2021-01-17 MED ORDER — LIDOCAINE-EPINEPHRINE 1 %-1:100000 IJ SOLN
INTRAMUSCULAR | Status: DC | PRN
  Administered 2021-01-17: 8 mL

## 2021-01-17 MED ORDER — ALBUTEROL SULFATE HFA 108 (90 BASE) MCG/ACT IN AERS
INHALATION_SPRAY | RESPIRATORY_TRACT | Status: AC
  Filled 2021-01-17: qty 6.7

## 2021-01-17 MED ORDER — FENTANYL CITRATE (PF) 100 MCG/2ML IJ SOLN
INTRAMUSCULAR | Status: DC | PRN
  Administered 2021-01-16: 100 ug via INTRAVENOUS
  Administered 2021-01-17: 50 ug via INTRAVENOUS
  Administered 2021-01-17: 100 ug via INTRAVENOUS

## 2021-01-17 MED ORDER — DEXMEDETOMIDINE (PRECEDEX) IN NS 20 MCG/5ML (4 MCG/ML) IV SYRINGE
PREFILLED_SYRINGE | INTRAVENOUS | Status: DC | PRN
  Administered 2021-01-17: 12 ug via INTRAVENOUS
  Administered 2021-01-17: 8 ug via INTRAVENOUS

## 2021-01-17 MED ORDER — MIDAZOLAM HCL 5 MG/5ML IJ SOLN
INTRAMUSCULAR | Status: DC | PRN
  Administered 2021-01-16: 2 mg via INTRAVENOUS

## 2021-01-17 MED ORDER — OXYCODONE HCL 5 MG PO TABS: 5.0000 mg | ORAL_TABLET | ORAL | Status: DC | PRN

## 2021-01-17 MED ORDER — DEXAMETHASONE SODIUM PHOSPHATE 10 MG/ML IJ SOLN
INTRAMUSCULAR | Status: DC | PRN
Start: 2021-01-17 — End: 2021-01-17
  Administered 2021-01-17: 10 mg via INTRAVENOUS

## 2021-01-17 MED ORDER — ACETAMINOPHEN 160 MG/5ML PO SOLN: 325.0000 mg | ORAL | Status: DC | PRN

## 2021-01-17 MED ORDER — AMISULPRIDE (ANTIEMETIC) 5 MG/2ML IV SOLN: 10.0000 mg | Freq: Once | INTRAVENOUS | Status: DC | PRN

## 2021-01-17 MED ORDER — ACETAMINOPHEN 10 MG/ML IV SOLN: 1000.0000 mg | Freq: Once | INTRAVENOUS | Status: DC | PRN

## 2021-01-17 MED ORDER — FENTANYL CITRATE (PF) 100 MCG/2ML IJ SOLN
INTRAMUSCULAR | Status: AC
  Filled 2021-01-17: qty 2

## 2021-01-17 MED ORDER — PROPOFOL 10 MG/ML IV BOLUS
INTRAVENOUS | Status: DC | PRN
  Administered 2021-01-16: 130 mg via INTRAVENOUS

## 2021-01-17 MED ORDER — OXYCODONE HCL 5 MG PO TABS: 5.0000 mg | ORAL_TABLET | Freq: Once | ORAL | Status: DC | PRN

## 2021-01-17 MED ORDER — IBUPROFEN 600 MG PO TABS: 600.0000 mg | ORAL_TABLET | Freq: Four times a day (QID) | ORAL | 0 refills | Status: AC | PRN

## 2021-01-17 MED ORDER — LIDOCAINE HCL (CARDIAC) PF 100 MG/5ML IV SOSY
PREFILLED_SYRINGE | INTRAVENOUS | Status: DC | PRN
  Administered 2021-01-16: 40 mg via INTRAVENOUS

## 2021-01-17 MED ORDER — ONDANSETRON HCL 4 MG/2ML IJ SOLN
INTRAMUSCULAR | Status: DC | PRN
  Administered 2021-01-17: 4 mg via INTRAVENOUS

## 2021-01-17 MED ORDER — OXYCODONE HCL 5 MG/5ML PO SOLN: 5.0000 mg | Freq: Once | ORAL | Status: DC | PRN

## 2021-01-17 MED ORDER — SODIUM CHLORIDE 0.9 % IR SOLN
Status: DC | PRN
  Administered 2021-01-17: 1

## 2021-01-17 MED ORDER — ALBUTEROL SULFATE HFA 108 (90 BASE) MCG/ACT IN AERS
INHALATION_SPRAY | RESPIRATORY_TRACT | Status: DC | PRN
  Administered 2021-01-17: 2 via RESPIRATORY_TRACT

## 2021-01-17 MED ORDER — SUCCINYLCHOLINE CHLORIDE 200 MG/10ML IV SOSY
PREFILLED_SYRINGE | INTRAVENOUS | Status: DC | PRN
  Administered 2021-01-16: 120 mg via INTRAVENOUS

## 2021-01-17 MED ORDER — FENTANYL CITRATE (PF) 100 MCG/2ML IJ SOLN
25.0000 ug | INTRAMUSCULAR | Status: DC | PRN
  Administered 2021-01-17: 50 ug via INTRAVENOUS
  Administered 2021-01-17: 25 ug via INTRAVENOUS

## 2021-01-17 MED ORDER — ACETAMINOPHEN 325 MG PO TABS: 325.0000 mg | ORAL_TABLET | ORAL | Status: DC | PRN

## 2021-01-17 MED ORDER — OXYCODONE HCL 5 MG PO TABS: 5.0000 mg | ORAL_TABLET | ORAL | 0 refills | Status: AC | PRN

## 2021-01-17 MED ORDER — SUGAMMADEX SODIUM 200 MG/2ML IV SOLN
INTRAVENOUS | Status: DC | PRN
  Administered 2021-01-17: 200 mg via INTRAVENOUS

## 2021-01-17 MED ORDER — ROCURONIUM BROMIDE 100 MG/10ML IV SOLN
INTRAVENOUS | Status: DC | PRN
  Administered 2021-01-17: 40 mg via INTRAVENOUS

## 2021-01-17 NOTE — Op Note (Signed)
Patient: Brenda Kane. Rajewski DOB: 2000-12-04 MRN: [id number]    PREOP DIAGNOSIS:  1.Ruptured ectopic pregnancy in right fallopian tube.  2. Failed medical therapy of methotrexate.  POSTOP DIAGNOSIS:  1.  Same as above.   PROCEDURES: Laparoscopic right salpingectomy for removal of ruptured ectopic pregnancy.   SURGEON: Dr. Hoover Browns.   ASSISTANT: None.   SCRUB TECHNICIAN: Cheryl.   ANESTHESIA: General ETA, Dr. Shona Simpson.   COMPLICATIONS: None.  IV FLUIDS: 1,100cc  URINE OUTPUT: 250 cc clear urine   EBL: minimal.    HEMOPERITONEUM: 200 cc.     FINDINGS: Normal uterus.  Moderate blood in posterior cul de sac.  Right fallopian tube dilated with ectopic pregnancy.  Normal right ovary. Normal left ovary and left fallopian tube. Normal appendix.     PROCEDURE:    Informed consent was obtained from the patient to undergo the procedure after discussing the risks benefits and alternatives of the procedure. She was taken to the operating room where anesthesia was administered without difficulty. Both arms were tucked and she was placed in the dorsal lithotomy position. She was prepped abdominally, vaginally and perineum in the usual sterile fashion. Foley catheter was placed in the bladder and A Hulka uterine manipulator was placed in the cervix.     Attention was then turned to the abdomen where lidocaine with epinephrine was instilled in the infraumbilical area. The skin was incised with a scalpel and and Veress needle used for closed entry.  Correct placement was confirmed with saline in syringe test with air bubbles returned on drawing back normal saline and hanging drop test.  The abdomen was insufflated with gas and abdominal pressure maintained at .  The 11 mm Excel trocar was placed under direct vision and the scope was placed in.  Patient was placed in trendelenburg and two 5 mm ports were placed on the left and right sides of the lower abdomen using the Excel trocar under  direct visualization. The above findings were noted.  The right fallopian tube with ectopic pregnancy was removed with the ligasure.  It was then placed in an endo catch bag and delivered through the umbilical port.  The pelvis was irrigated and suctioned out.  Hemostasis was noted on all the surfaces.  All instruments were then removed under direct visualization.  Umbilical incision was closed using 0 Vicryl  Suture.  The skin was closed with 4-0 Monocryl then dermabond.   The 2 side ports incisions were also closed with dermabond.  Honey comb dressing was placed at umbilical port site.  The Foley catheter and the Hulka manipulator were removed.  The patient was then awoken from anesthesia and she was taken to recovery room in stable condition   SPECIMENS: Right fallopian tube with ectopic pregnancy.   Pryor Guettler Sallye Ober, MD.  DATE: 01/17/2021 Time: 02:01 AM.

## 2021-01-17 NOTE — Anesthesia Procedure Notes (Signed)
Procedure Name: Intubation Date/Time: 01/17/2021 12:00 AM Performed by: Jamarria Real T, CRNA Pre-anesthesia Checklist: Patient identified, Emergency Drugs available, Suction available and Patient being monitored Patient Re-evaluated:Patient Re-evaluated prior to induction Oxygen Delivery Method: Circle system utilized Preoxygenation: Pre-oxygenation with 100% oxygen Induction Type: IV induction Ventilation: Mask ventilation without difficulty Laryngoscope Size: Miller and 2 Grade View: Grade I Tube type: Oral Tube size: 7.5 mm Number of attempts: 1 Airway Equipment and Method: Stylet and Oral airway Placement Confirmation: ETT inserted through vocal cords under direct vision, positive ETCO2 and breath sounds checked- equal and bilateral Secured at: 20 cm Tube secured with: Tape Dental Injury: Teeth and Oropharynx as per pre-operative assessment

## 2021-01-17 NOTE — Discharge Instructions (Signed)
Saryah,  1. You may remove the dressing in 3 days (01/20/2021).   2. After showering please ensure that the incisions are patted dry with a towel. 3.Do not have any intercourse for the next two weeks.  4.  Expect incisional soreness and some upper back pain which could be from the gas that was used during the procedure.  5. You may take over the counter stool softener if with constipation.  6. You may use over counter tylenol and ibuprofen for pain as needed.  If it is not improving you may use oxycodone.  Let me know if despite oxycodone use you experience unmanageable pain.   7.Please call me if with other symptoms such as nausea, vomiting, fevers or chills or anything different from your baseline, any problems or questions.  8.  Please call the office to schedule a 2 week post op follow up.     Dr. Hoover Browns.  Phone: [phone]  Extension [phone] if regular business hours. If after hours please choose option to speak to provider on call.

## 2021-01-17 NOTE — Transfer of Care (Signed)
Immediate Anesthesia Transfer of Care Note  Patient: Brenda Kane  Procedure(s) Performed: LAPAROSCOPY OPERATIVE, REMOVAL OF RIGHT FALLOPIAN TUBE WITH ECTOPIC PREGNANCY (Right)  Patient Location: PACU  Anesthesia Type:General  Level of Consciousness: drowsy  Airway & Oxygen Therapy: Patient Spontanous Breathing and Patient connected to nasal cannula oxygen  Post-op Assessment: Report given to RN, Post -op Vital signs reviewed and stable and Patient moving all extremities  Post vital signs: Reviewed and stable  Last Vitals:  Vitals Value Taken Time  BP 122/76 01/17/21 0135  Temp    Pulse 73 01/17/21 0136  Resp 14 01/17/21 0136  SpO2 100 % 01/17/21 0136  Vitals shown include unvalidated device data.  Last Pain:  Vitals:   01/16/21 2147  PainSc: 4       Patients Stated Pain Goal: 0 (01/16/21 2030)  Complications: No notable events documented.

## 2021-01-17 NOTE — Anesthesia Postprocedure Evaluation (Signed)
Anesthesia Post Note  Patient: SHAMMARA JARRETT  Procedure(s) Performed: LAPAROSCOPY OPERATIVE, REMOVAL OF RIGHT FALLOPIAN TUBE WITH ECTOPIC PREGNANCY (Right)     Patient location during evaluation: PACU Anesthesia Type: General Level of consciousness: awake and alert Pain management: pain level controlled Vital Signs Assessment: post-procedure vital signs reviewed and stable Respiratory status: spontaneous breathing, nonlabored ventilation, respiratory function stable and patient connected to nasal cannula oxygen Cardiovascular status: blood pressure returned to baseline and stable Postop Assessment: no apparent nausea or vomiting Anesthetic complications: no   No notable events documented.  Last Vitals:  Vitals:   01/17/21 0235 01/17/21 0250  BP: 108/64 107/72  Pulse: (!) 57 60  Resp: (!) 26 14  Temp:  (!) 36.2 C  SpO2: 100% 100%    Last Pain:  Vitals:   01/17/21 0235  PainSc: 4                  Shelton Silvas

## 2021-01-18 ENCOUNTER — Other Ambulatory Visit: Payer: Self-pay

## 2021-01-18 LAB — SURGICAL PATHOLOGY

## 2021-01-21 ENCOUNTER — Other Ambulatory Visit: Payer: Self-pay

## 2021-01-21 ENCOUNTER — Inpatient Hospital Stay (HOSPITAL_COMMUNITY)
Admission: AD | Admit: 2021-01-21 | Discharge: 2021-01-21 | Disposition: A | Discharge status: Home / Self Care | Payer: Medicaid Other | Attending: Obstetrics and Gynecology | Admitting: Obstetrics and Gynecology

## 2021-01-21 DIAGNOSIS — O089 Unspecified complication following an ectopic and molar pregnancy: Secondary | ICD-10-CM | POA: Insufficient documentation

## 2021-01-21 DIAGNOSIS — Z8759 Personal history of other complications of pregnancy, childbirth and the puerperium: Secondary | ICD-10-CM

## 2021-01-21 DIAGNOSIS — O00101 Right tubal pregnancy without intrauterine pregnancy: Secondary | ICD-10-CM | POA: Insufficient documentation

## 2021-01-21 NOTE — MAU Provider Note (Signed)
History Brenda Kane is a 20 y.o. G1P0 at [redacted]w[redacted]d who presents for blood work. Was supposed to return today for day 7 HCG s/p methotrexate. She presented to the MAU Tuesday with a ruptured ectopic pregnancy & had surgery. Was unsure of follow up she was to have. Denies abdominal pain or vaginal bleeding.   Physical exam BP 126/74 (BP Location: Right Arm)   Pulse 68   Temp 98.1 F (36.7 C) (Oral)   Resp 20   Ht 5\' 6"  (1.676 m)   Wt 55.7 kg   LMP 12/07/2020   SpO2 100%   BMI 19.84 kg/m   Physical Examination: General appearance - alert, well appearing, and in no distress Mental status - normal mood, behavior, speech, dress, motor activity, and thought processes Eyes - pupils equal and reactive, extraocular eye movements intact, sclera anicteric   Assessment 1. S/P ectopic pregnancy   -pt had right salpingectomy on Tuesday for her ectopic pregnancy. Per review of pathology report there was chorionic villi in the submitted fallopian tube. HCG today is not indicated.  -Per AVS from that visit, patient is to f/u with Dr. Wednesday in 2 weeks.    Plan Discharge home in stable condition Schedule 2 week follow up with Dr. Sallye Ober, Mechele Claude, NP

## 2021-01-21 NOTE — MAU Note (Signed)
Pt presents stating she's here for HCG level.

## 2021-04-04 ENCOUNTER — Other Ambulatory Visit (HOSPITAL_BASED_OUTPATIENT_CLINIC_OR_DEPARTMENT_OTHER): Payer: Self-pay

## 2021-04-04 MED ORDER — INFLUENZA VAC SPLIT QUAD 0.5 ML IM SUSY
PREFILLED_SYRINGE | INTRAMUSCULAR | 0 refills | Status: AC
  Filled 2021-04-04: qty 0.5, 1d supply, fill #0

## 2021-05-07 ENCOUNTER — Emergency Department (HOSPITAL_BASED_OUTPATIENT_CLINIC_OR_DEPARTMENT_OTHER): Payer: Medicaid Other | Admitting: Radiology

## 2021-05-07 ENCOUNTER — Other Ambulatory Visit: Payer: Self-pay

## 2021-05-07 ENCOUNTER — Emergency Department (HOSPITAL_BASED_OUTPATIENT_CLINIC_OR_DEPARTMENT_OTHER)
Admission: EM | Admit: 2021-05-07 | Discharge: 2021-05-07 | Disposition: A | Discharge status: Home / Self Care | Payer: Medicaid Other | Attending: Emergency Medicine | Admitting: Emergency Medicine

## 2021-05-07 ENCOUNTER — Encounter (HOSPITAL_BASED_OUTPATIENT_CLINIC_OR_DEPARTMENT_OTHER): Payer: Self-pay | Admitting: *Deleted

## 2021-05-07 DIAGNOSIS — J45909 Unspecified asthma, uncomplicated: Secondary | ICD-10-CM | POA: Insufficient documentation

## 2021-05-07 DIAGNOSIS — W19XXXA Unspecified fall, initial encounter: Secondary | ICD-10-CM | POA: Insufficient documentation

## 2021-05-07 DIAGNOSIS — S60221A Contusion of right hand, initial encounter: Secondary | ICD-10-CM | POA: Insufficient documentation

## 2021-05-07 DIAGNOSIS — S6991XA Unspecified injury of right wrist, hand and finger(s), initial encounter: Secondary | ICD-10-CM | POA: Diagnosis present

## 2021-05-07 NOTE — Discharge Instructions (Signed)
Recommend 1000 mg of Tylenol every 6 hours as needed for pain.  Recommend 600 mg ibuprofen every 8 hours as needed for pain.  Recommend ice every several hours.

## 2021-05-07 NOTE — ED Provider Notes (Signed)
[facility] EMERGENCY DEPT Provider Note   CSN: [id number] Arrival date & time: 05/07/21  4010     History Chief Complaint  Patient presents with   Hand Injury    Brenda Kane is a 20 y.o. female.  The history is provided by the patient.  Hand Injury Location:  Hand Hand location:  R hand Pain details:    Quality:  Aching   Radiates to:  Does not radiate   Severity:  Mild   Onset quality:  Gradual   Duration:  1 day   Timing:  Intermittent   Progression:  Waxing and waning Handedness:  Right-handed Relieved by:  Nothing Worsened by:  Nothing Associated symptoms: no back pain, no decreased range of motion, no fatigue, no fever, no muscle weakness, no neck pain, no numbness and no stiffness       Past Medical History:  Diagnosis Date   Allergy    Asthma    Scoliosis     Patient Active Problem List   Diagnosis Date Noted   Elevated blood-pressure reading, without diagnosis of hypertension 07/09/2019   Upper abdominal pain 08/05/2018   Leukocytosis 08/05/2018   Bacterial vaginitis 08/05/2018   Scoliosis 08/05/2018   Leg length difference, acquired 08/05/2018    Past Surgical History:  Procedure Laterality Date   FRACTURE SURGERY Left    wrist   LAPAROSCOPIC UNILATERAL SALPINGECTOMY Right 01/16/2021   Procedure: LAPAROSCOPY OPERATIVE, REMOVAL OF RIGHT FALLOPIAN TUBE WITH ECTOPIC PREGNANCY;  Surgeon: Hoover Browns, MD;  Location: [facility] OR;  Service: Gynecology;  Laterality: Right;     OB History     Gravida  1   Para      Term      Preterm      AB      Living         SAB      IAB      Ectopic      Multiple      Live Births              Family History  Problem Relation Age of Onset   Diabetes Mother     Social History   Tobacco Use   Smoking status: Never   Smokeless tobacco: Never  Vaping Use   Vaping Use: Never used  Substance Use Topics   Alcohol use: Not Currently   Drug use: No    Home  Medications Prior to Admission medications   Medication Sig Start Date End Date Taking? Authorizing Provider  albuterol (PROVENTIL HFA;VENTOLIN HFA) 108 (90 BASE) MCG/ACT inhaler Inhale 2 puffs into the lungs every 6 (six) hours as needed for wheezing.    [provider]  EPIPEN 2-PAK 0.3 MG/0.3ML SOAJ injection AS DIRECTED AS NEEDED FOR SYSTEMIC REACTION INJECTION 30 DAYS 12/02/18   [provider]  influenza vac split quadrivalent PF (FLUARIX) 0.5 ML injection Inject into the muscle. 04/04/21     montelukast (SINGULAIR) 10 MG tablet Take 10 mg by mouth at bedtime.    [provider]  oxyCODONE (OXY IR/ROXICODONE) 5 MG immediate release tablet Take 1 tablet (5 mg total) by mouth every 4 (four) hours as needed for severe pain or breakthrough pain. 01/17/21   Hoover Browns, MD    Allergies    Fish allergy, Other, and Shellfish allergy  Review of Systems   Review of Systems  Constitutional:  Negative for fatigue and fever.  Musculoskeletal:  Negative for back pain, neck pain and stiffness.  Physical Exam Updated Vital Signs BP 127/85 (BP Location: Left Arm)   Pulse 88   Temp 98.4 F (36.9 C)   Resp 14   Ht 5\' 6"  (1.676 m)   Wt 49 kg   LMP 04/26/2021   SpO2 98%   Breastfeeding Unknown   BMI 17.43 kg/m   Physical Exam Cardiovascular:     Pulses: Normal pulses.  Musculoskeletal:        General: Tenderness present. No swelling or deformity. Normal range of motion.     Cervical back: Normal range of motion.  Neurological:     Mental Status: She is alert.    ED Results / Procedures / Treatments   Labs (all labs ordered are listed, but only abnormal results are displayed) Labs Reviewed - No data to display  EKG None  Radiology DG Hand Complete Right  Result Date: 05/07/2021 CLINICAL DATA:  05/09/2021 yesterday with finger pain EXAM: RIGHT HAND - COMPLETE 3+ VIEW COMPARISON:  None. FINDINGS: There is no evidence of fracture or dislocation. There is no  evidence of arthropathy or other focal bone abnormality. Soft tissues are unremarkable. IMPRESSION: Normal radiographs. Electronically Signed   By: Larey Seat M.D.   On: 05/07/2021 09:39    Procedures Procedures   Medications Ordered in ED Medications - No data to display  ED Course  I have reviewed the triage vital signs and the nursing notes.  Pertinent labs & imaging results that were available during my care of the patient were reviewed by me and considered in my medical decision making (see chart for details).    MDM Rules/Calculators/A&P                           05/09/2021 here with pain to her second third and fourth digits on her right hand after injuring it yesterday on a fall.  No wrist pain.  No tenderness over the scaphoid area.  X-ray negative for fractures.  Overall suspect contusion/sprain.  Has good range of motion and no concern for ligamentous injury.  No laceration.  Recommend ice, Tylenol, ibuprofen.  Neurovascularly neuromuscularly intact.  Discharged in good condition.  Recommend follow-up with primary care doctor.  This chart was dictated using voice recognition software.  Despite best efforts to proofread,  errors can occur which can change the documentation meaning.   Final Clinical Impression(s) / ED Diagnoses Final diagnoses:  Contusion of right hand, initial encounter    Rx / DC Orders ED Discharge Orders     None        Marcie Bal, DO 05/07/21 [phone]

## 2021-05-07 NOTE — ED Notes (Signed)
Delay in removing pt from system due to registration in chart.

## 2021-05-07 NOTE — ED Triage Notes (Signed)
Pt fell yesterday injuring first 3 finger on rt hand.

## 2022-02-13 ENCOUNTER — Encounter: Payer: Self-pay | Admitting: Internal Medicine

## 2022-03-16 ENCOUNTER — Encounter (HOSPITAL_COMMUNITY): Payer: Self-pay

## 2022-03-16 ENCOUNTER — Emergency Department (HOSPITAL_COMMUNITY)
Admission: EM | Admit: 2022-03-16 | Discharge: 2022-03-16 | Disposition: A | Discharge status: Home / Self Care | Payer: BC Managed Care – PPO | Attending: Emergency Medicine | Admitting: Emergency Medicine

## 2022-03-16 ENCOUNTER — Emergency Department (HOSPITAL_COMMUNITY): Payer: BC Managed Care – PPO

## 2022-03-16 ENCOUNTER — Other Ambulatory Visit: Payer: Self-pay

## 2022-03-16 DIAGNOSIS — S0003XA Contusion of scalp, initial encounter: Secondary | ICD-10-CM | POA: Diagnosis not present

## 2022-03-16 DIAGNOSIS — S301XXA Contusion of abdominal wall, initial encounter: Secondary | ICD-10-CM | POA: Diagnosis not present

## 2022-03-16 DIAGNOSIS — Z23 Encounter for immunization: Secondary | ICD-10-CM | POA: Insufficient documentation

## 2022-03-16 DIAGNOSIS — S0990XA Unspecified injury of head, initial encounter: Secondary | ICD-10-CM | POA: Diagnosis present

## 2022-03-16 LAB — I-STAT BETA HCG BLOOD, ED (MC, WL, AP ONLY): I-stat hCG, quantitative: <5 m[IU]/mL (ref <5)

## 2022-03-16 MED ORDER — TETANUS-DIPHTH-ACELL PERTUSSIS 5-2.5-18.5 LF-MCG/0.5 IM SUSY
0.5000 mL | PREFILLED_SYRINGE | Freq: Once | INTRAMUSCULAR | Status: AC
  Administered 2022-03-16: 0.5 mL via INTRAMUSCULAR
  Filled 2022-03-16: qty 0.5

## 2022-03-16 MED ORDER — IBUPROFEN 200 MG PO TABS
600.0000 mg | ORAL_TABLET | Freq: Once | ORAL | Status: AC
  Administered 2022-03-16: 600 mg via ORAL
  Filled 2022-03-16: qty 3
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
## 2022-03-16 NOTE — ED Notes (Signed)
PD at bedside.
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
## 2022-03-19 ENCOUNTER — Ambulatory Visit
Admission: RE | Admit: 2022-03-19 | Discharge: 2022-03-19 | Disposition: A | Discharge status: Home / Self Care | Payer: Medicaid Other | Source: Ambulatory Visit | Attending: Physician Assistant | Admitting: Physician Assistant

## 2022-03-19 ENCOUNTER — Other Ambulatory Visit: Payer: Self-pay | Admitting: Physician Assistant

## 2022-03-19 DIAGNOSIS — J4520 Mild intermittent asthma, uncomplicated: Secondary | ICD-10-CM | POA: Diagnosis not present

## 2022-03-19 DIAGNOSIS — M898X1 Other specified disorders of bone, shoulder: Secondary | ICD-10-CM | POA: Diagnosis not present

## 2022-03-19 DIAGNOSIS — M791 Myalgia, unspecified site: Secondary | ICD-10-CM | POA: Diagnosis not present

## 2022-03-19 DIAGNOSIS — T7491XD Unspecified adult maltreatment, confirmed, subsequent encounter: Secondary | ICD-10-CM | POA: Diagnosis not present

## 2023-10-26 ENCOUNTER — Emergency Department (HOSPITAL_COMMUNITY)

## 2023-10-26 ENCOUNTER — Other Ambulatory Visit: Payer: Self-pay

## 2023-10-26 ENCOUNTER — Emergency Department (HOSPITAL_COMMUNITY)
Admission: EM | Admit: 2023-10-26 | Discharge: 2023-10-26 | Disposition: A | Discharge status: Home / Self Care | Attending: Emergency Medicine | Admitting: Emergency Medicine

## 2023-10-26 ENCOUNTER — Encounter (HOSPITAL_COMMUNITY): Payer: Self-pay

## 2023-10-26 DIAGNOSIS — G8929 Other chronic pain: Secondary | ICD-10-CM

## 2023-10-26 DIAGNOSIS — I10 Essential (primary) hypertension: Secondary | ICD-10-CM | POA: Diagnosis not present

## 2023-10-26 DIAGNOSIS — J45909 Unspecified asthma, uncomplicated: Secondary | ICD-10-CM | POA: Diagnosis not present

## 2023-10-26 DIAGNOSIS — Z79899 Other long term (current) drug therapy: Secondary | ICD-10-CM | POA: Insufficient documentation

## 2023-10-26 DIAGNOSIS — D72829 Elevated white blood cell count, unspecified: Secondary | ICD-10-CM | POA: Diagnosis not present

## 2023-10-26 DIAGNOSIS — M546 Pain in thoracic spine: Secondary | ICD-10-CM | POA: Insufficient documentation

## 2023-10-26 LAB — URINALYSIS, ROUTINE W REFLEX MICROSCOPIC
Glucose, UA: NEGATIVE mg/dL
Ketones, ur: 80 mg/dL — AB
Leukocytes,Ua: NEGATIVE
Nitrite: NEGATIVE
Protein, ur: 30 mg/dL — AB
Specific Gravity, Urine: 1.03 (ref 1.005–1.030)
pH: 6 (ref 5.0–8.0)

## 2023-10-26 LAB — CBC WITH DIFFERENTIAL/PLATELET
Abs Immature Granulocytes: 0.05 10*3/uL (ref 0.00–0.07)
Basophils Absolute: 0 10*3/uL (ref 0.0–0.1)
Basophils Relative: 0 %
Eosinophils Absolute: 0 10*3/uL (ref 0.0–0.5)
Eosinophils Relative: 0 %
HCT: 36.7 % (ref 36.0–46.0)
Hemoglobin: 11.9 g/dL — ABNORMAL LOW (ref 12.0–15.0)
Immature Granulocytes: 0 %
Lymphocytes Relative: 10 %
Lymphs Abs: 1.4 10*3/uL (ref 0.7–4.0)
MCH: 26.4 pg (ref 26.0–34.0)
MCHC: 32.4 g/dL (ref 30.0–36.0)
MCV: 81.6 fL (ref 80.0–100.0)
Monocytes Absolute: 1.1 10*3/uL — ABNORMAL HIGH (ref 0.1–1.0)
Monocytes Relative: 8 %
Neutro Abs: 11.1 10*3/uL — ABNORMAL HIGH (ref 1.7–7.7)
Neutrophils Relative %: 82 %
Platelets: 213 10*3/uL (ref 150–400)
RBC: 4.5 MIL/uL (ref 3.87–5.11)
RDW: 13.5 % (ref 11.5–15.5)
WBC: 13.7 10*3/uL — ABNORMAL HIGH (ref 4.0–10.5)
nRBC: 0 % (ref 0.0–0.2)

## 2023-10-26 LAB — URINALYSIS, MICROSCOPIC (REFLEX)

## 2023-10-26 LAB — BASIC METABOLIC PANEL WITH GFR
Anion gap: 11 (ref 5–15)
BUN: 6 mg/dL (ref 6–20)
CO2: 19 mmol/L — ABNORMAL LOW (ref 22–32)
Calcium: 8.8 mg/dL — ABNORMAL LOW (ref 8.9–10.3)
Chloride: 104 mmol/L (ref 98–111)
Creatinine, Ser: 0.79 mg/dL (ref 0.44–1.00)
GFR, Estimated: >60 mL/min (ref >60)
Glucose, Bld: 84 mg/dL (ref 70–99)
Potassium: 3.8 mmol/L (ref 3.5–5.1)
Sodium: 134 mmol/L — ABNORMAL LOW (ref 135–145)

## 2023-10-26 LAB — PREGNANCY, URINE: Preg Test, Ur: NEGATIVE

## 2023-10-26 MED ORDER — MORPHINE SULFATE (PF) 4 MG/ML IV SOLN
4.0000 mg | Freq: Once | INTRAVENOUS | Status: AC
  Administered 2023-10-26: 4 mg via INTRAVENOUS
  Filled 2023-10-26: qty 1

## 2023-10-26 MED ORDER — KETOROLAC TROMETHAMINE 30 MG/ML IJ SOLN
15.0000 mg | Freq: Once | INTRAMUSCULAR | Status: AC
Start: 2023-10-26 — End: 2023-10-26
  Administered 2023-10-26: 15 mg via INTRAVENOUS
  Filled 2023-10-26: qty 1

## 2023-10-26 MED ORDER — ONDANSETRON HCL 4 MG/2ML IJ SOLN
4.0000 mg | Freq: Once | INTRAMUSCULAR | Status: AC
  Administered 2023-10-26: 4 mg via INTRAVENOUS
  Filled 2023-10-26: qty 2

## 2023-10-26 MED ORDER — ACETAMINOPHEN 500 MG PO TABS
1000.0000 mg | ORAL_TABLET | Freq: Once | ORAL | Status: AC
  Administered 2023-10-26: 1000 mg via ORAL
  Filled 2023-10-26: qty 2

## 2023-10-26 MED ORDER — LIDOCAINE 5 % EX PTCH: 1.0000 | MEDICATED_PATCH | CUTANEOUS | 0 refills | Status: AC

## 2023-10-26 MED ORDER — METHOCARBAMOL 500 MG PO TABS
500.0000 mg | ORAL_TABLET | Freq: Two times a day (BID) | ORAL | 0 refills | Status: AC
Start: 2023-10-26 — End: ?

## 2023-10-26 NOTE — ED Provider Triage Note (Signed)
 Emergency Medicine Provider Triage Evaluation Note  Brenda Kane , a 23 y.o. female  was evaluated in triage.  Pt complains of back pain.  Pt reports she has had a history of back pain   Review of Systems  Positive: Back pain  Negative: fever  Physical Exam  BP (!) 130/94   Pulse (!) 118   Temp 99.4 F (37.4 C)   Resp 18   Ht 5\' 6"  (1.676 m)   Wt 49.9 kg   SpO2 100%   BMI 17.75 kg/m  Gen:   Awake, no distress   Resp:  Normal effort  MSK:   Moves extremities without difficulty  Other:  Diffusely tender thoracic spine  Medical Decision Making  Medically screening exam initiated at 3:14 PM.  Appropriate orders placed.  Kristi Petties was informed that the remainder of the evaluation will be completed by another provider, this initial triage assessment does not replace that evaluation, and the importance of remaining in the ED until their evaluation is complete.     Sandi Crosby, PA-C 10/26/23 5621

## 2023-10-26 NOTE — ED Triage Notes (Signed)
 Pt came in via POV d/t bad back pain all up her spine (per pt) that has been making her need to lay down for comfort the last few days & wants to be checked. A/Ox4, rates her pain 10/10 during triage, Hx of scoliosis.

## 2023-10-26 NOTE — Discharge Instructions (Addendum)
 While you are in the emergency room, he had blood work done that was normal.  You had a mild elevation in your white blood cell count, but this is most likely nothing to worry about.  Your x-rays do show scoliosis.  We treated with medications here in the emergency room.  I have sent your prescription for medicine called Robaxin.  You can take that up to 2 times per day.  Do not drive if you are taking that medication.  I would also recommend that you take 400 mg of ibuprofen  every 6 hours, 1000 mg of Tylenol  every 8 hours.  I have included telephone number for a spine doctor.  Please call them this week to set up a follow-up appointment.

## 2023-10-26 NOTE — ED Provider Notes (Signed)
 [place] EMERGENCY DEPARTMENT AT [facility] Provider Note  CSN: [id number] Arrival date & time: 10/26/23 1411  Chief Complaint(s) Back Pain  HPI Brenda Kane is a 23 y.o. female with history of scoliosis who is here today for back pain.  Patient states that over the last 1 day she has been having increasing pain in her thoracic spine.  Patient was treated for UTI in mid April of this past year, however she denies any dysuria or urinary symptoms.  No history of kidney stones.  Past Medical History Past Medical History:  Diagnosis Date   Allergy    Asthma    Scoliosis    Patient Active Problem List   Diagnosis Date Noted   Elevated blood-pressure reading, without diagnosis of hypertension 07/09/2019   Upper abdominal pain 08/05/2018   Leukocytosis 08/05/2018   Bacterial vaginitis 08/05/2018   Scoliosis 08/05/2018   Leg length difference, acquired 08/05/2018   Home Medication(s) Prior to Admission medications   Medication Sig Start Date End Date Taking? Authorizing Provider  lidocaine  (LIDODERM ) 5 % Place 1 patch onto the skin daily. Remove & Discard patch within 12 hours or as directed by MD 10/26/23  Yes Nathanael Baker, DO  methocarbamol (ROBAXIN) 500 MG tablet Take 1 tablet (500 mg total) by mouth 2 (two) times daily. 10/26/23  Yes Afton Horse T, DO  albuterol  (PROVENTIL  HFA;VENTOLIN  HFA) 108 (90 BASE) MCG/ACT inhaler Inhale 2 puffs into the lungs every 6 (six) hours as needed for wheezing.    [provider]  EPIPEN  2-PAK 0.3 MG/0.3ML SOAJ injection AS DIRECTED AS NEEDED FOR SYSTEMIC REACTION INJECTION 30 DAYS 12/02/18   [provider]  influenza vac split quadrivalent PF (FLUARIX) 0.5 ML injection Inject into the muscle. 04/04/21     montelukast (SINGULAIR) 10 MG tablet Take 10 mg by mouth at bedtime.    [provider]  oxyCODONE  (OXY IR/ROXICODONE ) 5 MG immediate release tablet Take 1 tablet (5 mg total) by mouth every 4 (four)  hours as needed for severe pain or breakthrough pain. 01/17/21   Vernal Gold, MD                                                                                                                                    Past Surgical History Past Surgical History:  Procedure Laterality Date   FRACTURE SURGERY Left    wrist   LAPAROSCOPIC UNILATERAL SALPINGECTOMY Right 01/16/2021   Procedure: LAPAROSCOPY OPERATIVE, REMOVAL OF RIGHT FALLOPIAN TUBE WITH ECTOPIC PREGNANCY;  Surgeon: Vernal Gold, MD;  Location: [facility] OR;  Service: Gynecology;  Laterality: Right;   Family History Family History  Problem Relation Age of Onset   Diabetes Mother     Social History Social History   Tobacco Use   Smoking status: Never   Smokeless tobacco: Never  Vaping Use   Vaping status: Never Used  Substance Use Topics   Alcohol  use: Not Currently   Drug use: No   Allergies Fish allergy, Other, and Shellfish allergy  Review of Systems Review of Systems  Physical Exam Vital Signs  I have reviewed the triage vital signs BP 121/82 (BP Location: Left Arm)   Pulse 98   Temp 99.3 F (37.4 C) (Oral)   Resp 17   Ht 5\' 6"  (1.676 m)   Wt 49.9 kg   SpO2 100%   BMI 17.75 kg/m   Physical Exam Vitals reviewed.  Eyes:     Pupils: Pupils are equal, round, and reactive to light.  Cardiovascular:     Rate and Rhythm: Normal rate.  Pulmonary:     Effort: Pulmonary effort is normal.  Abdominal:     General: Abdomen is flat.     Palpations: Abdomen is soft.  Musculoskeletal:        General: Normal range of motion.     Cervical back: Normal range of motion.  Neurological:     General: No focal deficit present.     Mental Status: She is alert.     Comments: 5 out of 5 strength with plantar and dorsi flexion.  Negative straight leg test bilaterally.  No numbness or tingling in lower extremities, no saddle anesthesia.     ED Results and Treatments Labs (all labs ordered are listed, but only abnormal results are  displayed) Labs Reviewed  URINALYSIS, ROUTINE W REFLEX MICROSCOPIC - Abnormal; Notable for the following components:      Result Value   APPearance HAZY (*)    Hgb urine dipstick MODERATE (*)    Bilirubin Urine SMALL (*)    Ketones, ur 80 (*)    Protein, ur 30 (*)    All other components within normal limits  BASIC METABOLIC PANEL WITH GFR - Abnormal; Notable for the following components:   Sodium 134 (*)    CO2 19 (*)    Calcium 8.8 (*)    All other components within normal limits  CBC WITH DIFFERENTIAL/PLATELET - Abnormal; Notable for the following components:   WBC 13.7 (*)    Hemoglobin 11.9 (*)    Neutro Abs 11.1 (*)    Monocytes Absolute 1.1 (*)    All other components within normal limits  URINALYSIS, MICROSCOPIC (REFLEX) - Abnormal; Notable for the following components:   Bacteria, UA RARE (*)    All other components within normal limits  URINE CULTURE  PREGNANCY, URINE                                                                                                                          Radiology DG Lumbar Spine Complete Result Date: 10/26/2023 CLINICAL DATA:  Back pain, scoliosis EXAM: LUMBAR SPINE - COMPLETE 4+ VIEW COMPARISON:  Thoracic spine today.  Lumbar spine 03/16/2022 FINDINGS: Slight convex rightward scoliosis centered in the mid to lower thoracic spine and leftward scoliosis in the mid lumbar spine. Lumbar spine appearance is stable since 2023. No fracture.  Disc spaces maintained. No subluxation. IMPRESSION: Slight S-shaped thoracolumbar scoliosis. Unchanged since prior study. No acute bony abnormality. Electronically Signed   By: Janeece Mechanic M.D.   On: 10/26/2023 19:24   DG Thoracic Spine 2 View Result Date: 10/26/2023 CLINICAL DATA:  Back pain EXAM: THORACIC SPINE 2 VIEWS COMPARISON:  Thoracic spine x-ray 03/16/2022 FINDINGS: Mild dextroconvex curvature of the thoracic spine centered at the T9 level appears unchanged. There is no acute fracture dislocations.  Spinal alignment is normal. Disc spaces are maintained. IMPRESSION: Mild dextroconvex curvature of the thoracic spine appears unchanged. No acute fracture or dislocation. Electronically Signed   By: Tyron Gallon M.D.   On: 10/26/2023 19:23    Pertinent labs & imaging results that were available during my care of the patient were reviewed by me and considered in my medical decision making (see MDM for details).  Medications Ordered in ED Medications  morphine (PF) 4 MG/ML injection 4 mg (4 mg Intravenous Given 10/26/23 1819)  ondansetron  (ZOFRAN ) injection 4 mg (4 mg Intravenous Given 10/26/23 1818)  ketorolac (TORADOL) 30 MG/ML injection 15 mg (15 mg Intravenous Given 10/26/23 1930)  acetaminophen  (TYLENOL ) tablet 1,000 mg (1,000 mg Oral Given 10/26/23 1930)                                                                                                                                     Procedures Procedures  (including critical care time)  Medical Decision Making / ED Course   This patient presents to the ED for concern of back pain, this involves an extensive number of treatment options, and is a complaint that carries with it a high risk of complications and morbidity.  The differential diagnosis includes chronic back pain, scoliosis, consider pyelonephritis, less likely nephrolithiasis.  MDM: Obtain plain films of the patient's thoracic and lumbar spine given her known history of scoliosis.  Patient does not follow with a specialist for this.  Will obtain a urinalysis on the patient, basic blood work.  Reassessment 7:45 PM-patient's urine not definitive for cystitis.  Have added on a urine culture.  She does not have any urinary symptoms at this time.  Mild leukocytosis, trace ketones in the urine.  Blood work otherwise normal.  Patient responded well to therapy.  Will discharge patient with Robaxin and lidocaine  patches.  Will provide her with spine follow-up.   Lab Tests: -I  ordered, reviewed, and interpreted labs.   The pertinent results include:   Labs Reviewed  URINALYSIS, ROUTINE W REFLEX MICROSCOPIC - Abnormal; Notable for the following components:      Result Value   APPearance HAZY (*)    Hgb urine dipstick MODERATE (*)    Bilirubin Urine SMALL (*)    Ketones, ur 80 (*)    Protein, ur 30 (*)    All other components within normal limits  BASIC METABOLIC PANEL WITH GFR - Abnormal; Notable for the following  components:   Sodium 134 (*)    CO2 19 (*)    Calcium 8.8 (*)    All other components within normal limits  CBC WITH DIFFERENTIAL/PLATELET - Abnormal; Notable for the following components:   WBC 13.7 (*)    Hemoglobin 11.9 (*)    Neutro Abs 11.1 (*)    Monocytes Absolute 1.1 (*)    All other components within normal limits  URINALYSIS, MICROSCOPIC (REFLEX) - Abnormal; Notable for the following components:   Bacteria, UA RARE (*)    All other components within normal limits  URINE CULTURE  PREGNANCY, URINE      Imaging Studies ordered: I ordered imaging studies including plain films of the thoracic and lumbar spine I independently visualized and interpreted imaging. I agree with the radiologist interpretation   Medicines ordered and prescription drug management: Meds ordered this encounter  Medications   morphine (PF) 4 MG/ML injection 4 mg   ondansetron  (ZOFRAN ) injection 4 mg   ketorolac (TORADOL) 30 MG/ML injection 15 mg   acetaminophen  (TYLENOL ) tablet 1,000 mg   methocarbamol (ROBAXIN) 500 MG tablet    Sig: Take 1 tablet (500 mg total) by mouth 2 (two) times daily.    Dispense:  20 tablet    Refill:  0   lidocaine  (LIDODERM ) 5 %    Sig: Place 1 patch onto the skin daily. Remove & Discard patch within 12 hours or as directed by MD    Dispense:  10 patch    Refill:  0    -I have reviewed the patients home medicines and have made adjustments as needed  Cardiac Monitoring: The patient was maintained on a cardiac monitor.  I  personally viewed and interpreted the cardiac monitored which showed an underlying rhythm of: Normal sinus rhythm  Social Determinants of Health:  Factors impacting patients care include: Lack of access to primary care   Reevaluation: After the interventions noted above, I reevaluated the patient and found that they have :improved  Co morbidities that complicate the patient evaluation  Past Medical History:  Diagnosis Date   Allergy    Asthma    Scoliosis       Dispostion: I considered admission for this patient, however she is appropriate for outpatient follow-up     Final Clinical Impression(s) / ED Diagnoses Final diagnoses:  Chronic thoracic back pain, unspecified back pain laterality     @PCDICTATION @    Afton Horse T, DO 10/26/23 1951

## 2023-10-28 LAB — URINE CULTURE: Culture: <10000 — AB

## 2023-11-26 ENCOUNTER — Telehealth: Payer: Self-pay

## 2023-11-26 NOTE — Telephone Encounter (Signed)
 I returned Brenda Kane's call to inform her that we need a referral from her PCP. Brenda Kane stated that she was inpatient but does not know the provider's name that she saw. She mentioned that she would return my call when she has the discharge paperwork available. I checked her discharge note from the ED, it mentions an outpatient Follow up. I am seeking advice from leadership for Brenda Kane's next steps to be scheduled.

## 2023-12-02 ENCOUNTER — Telehealth: Payer: Self-pay

## 2023-12-02 NOTE — Telephone Encounter (Signed)
 I returned Brenda Kane's call to inform her that she needs a referral sent in to see a Hematologist. Misha stated that she will get that sent in to us .
